# Patient Record
Sex: Female | Born: 1990 | Hispanic: No | Marital: Single | Smoking: Current every day smoker
Health system: Southern US, Community
[De-identification: ages and names within clinical notes are randomized; demographics above are authoritative.]

## PROBLEM LIST (undated history)

## (undated) DIAGNOSIS — J40 Bronchitis, not specified as acute or chronic: Secondary | ICD-10-CM

## (undated) DIAGNOSIS — I10 Essential (primary) hypertension: Secondary | ICD-10-CM

## (undated) HISTORY — PX: TONSILLECTOMY: SUR1361

---

## 2015-05-05 ENCOUNTER — Encounter (INDEPENDENT_AMBULATORY_CARE_PROVIDER_SITE_OTHER): Payer: Self-pay | Admitting: Family Medicine

## 2015-05-05 ENCOUNTER — Telehealth (INDEPENDENT_AMBULATORY_CARE_PROVIDER_SITE_OTHER): Payer: Self-pay | Admitting: Family Medicine

## 2015-05-05 DIAGNOSIS — K2970 Gastritis, unspecified, without bleeding: Principal | ICD-10-CM

## 2015-05-05 NOTE — Telephone Encounter (Signed)
Please schedule pt in fellows clinic to establish care at [facility]

## 2015-05-05 NOTE — Telephone Encounter (Signed)
Please triage referral to GI found in media tab.

## 2015-05-09 NOTE — Telephone Encounter (Signed)
Called the patient to schedule an appointment with GI. The person who answered the phone stated she was unavailable and the best time to reach her would be before 11:00am. Will try again on a different day.

## 2015-07-18 ENCOUNTER — Ambulatory Visit (HOSPITAL_BASED_OUTPATIENT_CLINIC_OR_DEPARTMENT_OTHER): Payer: MEDICAID | Admitting: Student in an Organized Health Care Education/Training Program

## 2016-06-07 ENCOUNTER — Encounter (HOSPITAL_COMMUNITY): Payer: Self-pay

## 2016-06-07 ENCOUNTER — Emergency Department (HOSPITAL_COMMUNITY)
Admission: EM | Admit: 2016-06-07 | Discharge: 2016-06-07 | Disposition: A | Discharge status: Home / Self Care | Payer: Self-pay | Attending: Emergency Medicine | Admitting: Emergency Medicine

## 2016-06-07 DIAGNOSIS — R042 Hemoptysis: Secondary | ICD-10-CM | POA: Insufficient documentation

## 2016-06-07 DIAGNOSIS — R112 Nausea with vomiting, unspecified: Secondary | ICD-10-CM | POA: Insufficient documentation

## 2016-06-07 DIAGNOSIS — R1011 Right upper quadrant pain: Secondary | ICD-10-CM | POA: Insufficient documentation

## 2016-06-07 DIAGNOSIS — Z5321 Procedure and treatment not carried out due to patient leaving prior to being seen by health care provider: Secondary | ICD-10-CM | POA: Insufficient documentation

## 2016-06-07 LAB — COMPREHENSIVE METABOLIC PANEL
ALBUMIN: 4.1 g/dL (ref 3.5–5.0)
ALK PHOS: 68 U/L (ref 38–126)
ALT: 20 U/L (ref 14–54)
ANION GAP: 8 (ref 5–15)
AST: 23 U/L (ref 15–41)
BILIRUBIN TOTAL: 0.8 mg/dL (ref 0.3–1.2)
BUN: 6 mg/dL (ref 6–20)
CALCIUM: 9.3 mg/dL (ref 8.9–10.3)
CO2: 25 mmol/L (ref 22–32)
CREATININE: 0.73 mg/dL (ref 0.44–1.00)
Chloride: 106 mmol/L (ref 101–111)
GFR calc Af Amer: >60 mL/min (ref >60)
GFR calc non Af Amer: >60 mL/min (ref >60)
Glucose, Bld: 94 mg/dL (ref 65–99)
Potassium: 4.1 mmol/L (ref 3.5–5.1)
Sodium: 139 mmol/L (ref 135–145)
TOTAL PROTEIN: 7 g/dL (ref 6.5–8.1)

## 2016-06-07 LAB — CBC
HCT: 41.3 % (ref 36.0–46.0)
Hemoglobin: 14.1 g/dL (ref 12.0–15.0)
MCH: 30.9 pg (ref 26.0–34.0)
MCHC: 34.1 g/dL (ref 30.0–36.0)
MCV: 90.4 fL (ref 78.0–100.0)
PLATELETS: 160 10*3/uL (ref 150–400)
RBC: 4.57 MIL/uL (ref 3.87–5.11)
RDW: 12.4 % (ref 11.5–15.5)
WBC: 6.2 10*3/uL (ref 4.0–10.5)

## 2016-06-07 LAB — LIPASE, BLOOD: Lipase: 12 U/L (ref 11–51)

## 2016-06-07 NOTE — ED Notes (Signed)
Family concerned that pt is coughing up blood.  Small blood tinged sputum in emesis bag.  Family notified of stable vital signs.

## 2016-06-07 NOTE — ED Triage Notes (Signed)
Pt states abd pain X1 week. RUQ pain. She reports nausea and vomiting with lack of appetite. Pt also coughing up a small amount of blood tinged sputum.

## 2016-06-28 ENCOUNTER — Emergency Department: Payer: Medicaid - Out of State

## 2016-06-28 ENCOUNTER — Emergency Department: Payer: Medicaid - Out of State | Admitting: *Deleted

## 2016-06-28 ENCOUNTER — Observation Stay
Admission: EM | Admit: 2016-06-28 | Discharge: 2016-06-30 | Disposition: A | Discharge status: Home / Self Care | Payer: Medicaid - Out of State | Attending: Obstetrics and Gynecology | Admitting: Obstetrics and Gynecology

## 2016-06-28 ENCOUNTER — Encounter
Admission: EM | Disposition: A | Discharge status: Home / Self Care | Payer: Self-pay | Source: Home / Self Care | Attending: Emergency Medicine

## 2016-06-28 ENCOUNTER — Encounter: Payer: Self-pay | Admitting: Emergency Medicine

## 2016-06-28 DIAGNOSIS — F17210 Nicotine dependence, cigarettes, uncomplicated: Secondary | ICD-10-CM | POA: Insufficient documentation

## 2016-06-28 DIAGNOSIS — N7003 Acute salpingitis and oophoritis: Secondary | ICD-10-CM | POA: Diagnosis present

## 2016-06-28 DIAGNOSIS — R102 Pelvic and perineal pain: Secondary | ICD-10-CM

## 2016-06-28 DIAGNOSIS — R109 Unspecified abdominal pain: Secondary | ICD-10-CM

## 2016-06-28 DIAGNOSIS — N7091 Salpingitis, unspecified: Principal | ICD-10-CM | POA: Insufficient documentation

## 2016-06-28 HISTORY — PX: DIAGNOSTIC LAPAROSCOPY WITH REMOVAL OF ECTOPIC PREGNANCY: SHX6449

## 2016-06-28 LAB — WET PREP, GENITAL
CLUE CELLS WET PREP: NONE SEEN
Sperm: NONE SEEN
TRICH WET PREP: NONE SEEN
YEAST WET PREP: NONE SEEN

## 2016-06-28 LAB — URINALYSIS, COMPLETE (UACMP) WITH MICROSCOPIC
Bacteria, UA: NONE SEEN
Bilirubin Urine: NEGATIVE
Glucose, UA: NEGATIVE mg/dL
KETONES UR: 5 mg/dL — AB
Leukocytes, UA: NEGATIVE
Nitrite: NEGATIVE
PROTEIN: 30 mg/dL — AB
Specific Gravity, Urine: 1.018 (ref 1.005–1.030)
pH: 6 (ref 5.0–8.0)

## 2016-06-28 LAB — COMPREHENSIVE METABOLIC PANEL
ALBUMIN: 4.4 g/dL (ref 3.5–5.0)
ALT: 14 U/L (ref 14–54)
ANION GAP: 7 (ref 5–15)
AST: 18 U/L (ref 15–41)
Alkaline Phosphatase: 73 U/L (ref 38–126)
BUN: 7 mg/dL (ref 6–20)
CHLORIDE: 106 mmol/L (ref 101–111)
CO2: 24 mmol/L (ref 22–32)
Calcium: 9.4 mg/dL (ref 8.9–10.3)
Creatinine, Ser: 0.6 mg/dL (ref 0.44–1.00)
GFR calc non Af Amer: >60 mL/min (ref >60)
GLUCOSE: 100 mg/dL — AB (ref 65–99)
Potassium: 3.8 mmol/L (ref 3.5–5.1)
SODIUM: 137 mmol/L (ref 135–145)
Total Bilirubin: 0.9 mg/dL (ref 0.3–1.2)
Total Protein: 8 g/dL (ref 6.5–8.1)

## 2016-06-28 LAB — CBC
HCT: 41.7 % (ref 35.0–47.0)
HEMOGLOBIN: 14.1 g/dL (ref 12.0–16.0)
MCH: 30.7 pg (ref 26.0–34.0)
MCHC: 33.8 g/dL (ref 32.0–36.0)
MCV: 90.8 fL (ref 80.0–100.0)
Platelets: 176 10*3/uL (ref 150–440)
RBC: 4.59 MIL/uL (ref 3.80–5.20)
RDW: 13 % (ref 11.5–14.5)
WBC: 11.5 10*3/uL — ABNORMAL HIGH (ref 3.6–11.0)

## 2016-06-28 LAB — CHLAMYDIA/NGC RT PCR (ARMC ONLY)
Chlamydia Tr: NOT DETECTED
N gonorrhoeae: DETECTED — AB

## 2016-06-28 LAB — HCG, QUANTITATIVE, PREGNANCY: HCG, BETA CHAIN, QUANT, S: 1 m[IU]/mL (ref <5)

## 2016-06-28 LAB — LIPASE, BLOOD: LIPASE: 22 U/L (ref 11–51)

## 2016-06-28 SURGERY — LAPAROSCOPY, WITH ECTOPIC PREGNANCY SURGICAL TREATMENT
Anesthesia: General | Site: Abdomen | Wound class: Clean Contaminated

## 2016-06-28 MED ORDER — PROMETHAZINE HCL 25 MG/ML IJ SOLN: 6.2500 mg | INTRAMUSCULAR | Status: DC | PRN

## 2016-06-28 MED ORDER — PROPOFOL 10 MG/ML IV BOLUS
INTRAVENOUS | Status: AC
  Filled 2016-06-28: qty 20

## 2016-06-28 MED ORDER — FENTANYL CITRATE (PF) 100 MCG/2ML IJ SOLN
INTRAMUSCULAR | Status: AC
  Administered 2016-06-28: 100 ug via INTRAVENOUS
  Filled 2016-06-28: qty 2

## 2016-06-28 MED ORDER — DOXYCYCLINE HYCLATE 100 MG IV SOLR
100.0000 mg | Freq: Once | INTRAVENOUS | Status: AC
  Administered 2016-06-28 (×2): 100 mg via INTRAVENOUS
  Filled 2016-06-28: qty 100

## 2016-06-28 MED ORDER — MORPHINE SULFATE (PF) 4 MG/ML IV SOLN
4.0000 mg | Freq: Once | INTRAVENOUS | Status: AC
  Administered 2016-06-28: 4 mg via INTRAVENOUS
  Filled 2016-06-28: qty 1

## 2016-06-28 MED ORDER — FENTANYL CITRATE (PF) 100 MCG/2ML IJ SOLN
INTRAMUSCULAR | Status: AC
  Filled 2016-06-28: qty 2

## 2016-06-28 MED ORDER — FENTANYL CITRATE (PF) 100 MCG/2ML IJ SOLN
INTRAMUSCULAR | Status: AC
  Administered 2016-06-28: 25 ug via INTRAVENOUS
  Filled 2016-06-28: qty 2

## 2016-06-28 MED ORDER — ONDANSETRON HCL 4 MG/2ML IJ SOLN
INTRAMUSCULAR | Status: AC
  Administered 2016-06-28: 4 mg via INTRAVENOUS
  Filled 2016-06-28: qty 2

## 2016-06-28 MED ORDER — ONDANSETRON HCL 4 MG/2ML IJ SOLN
4.0000 mg | Freq: Once | INTRAMUSCULAR | Status: AC
  Administered 2016-06-28: 4 mg via INTRAVENOUS

## 2016-06-28 MED ORDER — LIDOCAINE HCL (CARDIAC) 20 MG/ML IV SOLN
INTRAVENOUS | Status: DC | PRN
Start: 2016-06-28 — End: 2016-06-28
  Administered 2016-06-28: 100 mg via INTRAVENOUS

## 2016-06-28 MED ORDER — GLYCOPYRROLATE 0.2 MG/ML IJ SOLN
INTRAMUSCULAR | Status: AC
  Filled 2016-06-28: qty 1

## 2016-06-28 MED ORDER — LIDOCAINE HCL (PF) 2 % IJ SOLN
INTRAMUSCULAR | Status: AC
  Filled 2016-06-28: qty 2

## 2016-06-28 MED ORDER — DEXAMETHASONE SODIUM PHOSPHATE 10 MG/ML IJ SOLN
INTRAMUSCULAR | Status: AC
  Filled 2016-06-28: qty 1

## 2016-06-28 MED ORDER — KETOROLAC TROMETHAMINE 30 MG/ML IJ SOLN
INTRAMUSCULAR | Status: AC
  Filled 2016-06-28: qty 1

## 2016-06-28 MED ORDER — LACTATED RINGERS IV SOLN
INTRAVENOUS | Status: DC
  Administered 2016-06-28 – 2016-06-30 (×4): via INTRAVENOUS
  Administered 2016-06-30: 125 mL/h via INTRAVENOUS

## 2016-06-28 MED ORDER — MEPERIDINE HCL 50 MG/ML IJ SOLN: 6.2500 mg | INTRAMUSCULAR | Status: DC | PRN

## 2016-06-28 MED ORDER — ROCURONIUM BROMIDE 100 MG/10ML IV SOLN
INTRAVENOUS | Status: AC
  Filled 2016-06-28: qty 1

## 2016-06-28 MED ORDER — FENTANYL CITRATE (PF) 100 MCG/2ML IJ SOLN
100.0000 ug | Freq: Once | INTRAMUSCULAR | Status: AC
  Administered 2016-06-28: 100 ug via INTRAVENOUS

## 2016-06-28 MED ORDER — HYDROMORPHONE HCL 1 MG/ML IJ SOLN
INTRAMUSCULAR | Status: AC
  Administered 2016-06-28: 0.5 mg via INTRAVENOUS
  Filled 2016-06-28: qty 1

## 2016-06-28 MED ORDER — MIDAZOLAM HCL 2 MG/2ML IJ SOLN
INTRAMUSCULAR | Status: AC
  Filled 2016-06-28: qty 2

## 2016-06-28 MED ORDER — SODIUM CHLORIDE 0.9 % IR SOLN
Status: DC | PRN
  Administered 2016-06-28: 400 mL

## 2016-06-28 MED ORDER — OXYCODONE HCL 5 MG PO TABS: 5.0000 mg | ORAL_TABLET | Freq: Once | ORAL | Status: DC | PRN

## 2016-06-28 MED ORDER — SODIUM CHLORIDE 0.9 % IV BOLUS (SEPSIS)
1000.0000 mL | Freq: Once | INTRAVENOUS | Status: AC
  Administered 2016-06-28: 1000 mL via INTRAVENOUS

## 2016-06-28 MED ORDER — FENTANYL CITRATE (PF) 100 MCG/2ML IJ SOLN
25.0000 ug | INTRAMUSCULAR | Status: AC | PRN
  Administered 2016-06-28 (×6): 25 ug via INTRAVENOUS

## 2016-06-28 MED ORDER — ONDANSETRON HCL 4 MG/2ML IJ SOLN
INTRAMUSCULAR | Status: DC | PRN
Start: 2016-06-28 — End: 2016-06-28
  Administered 2016-06-28: 4 mg via INTRAVENOUS

## 2016-06-28 MED ORDER — SUCCINYLCHOLINE CHLORIDE 20 MG/ML IJ SOLN
INTRAMUSCULAR | Status: DC | PRN
  Administered 2016-06-28: 120 mg via INTRAVENOUS

## 2016-06-28 MED ORDER — LACTATED RINGERS IV SOLN
INTRAVENOUS | Status: DC | PRN
  Administered 2016-06-28: 22:00:00 via INTRAVENOUS

## 2016-06-28 MED ORDER — OXYCODONE HCL 5 MG/5ML PO SOLN: 5.0000 mg | Freq: Once | ORAL | Status: DC | PRN

## 2016-06-28 MED ORDER — ONDANSETRON HCL 4 MG/2ML IJ SOLN
INTRAMUSCULAR | Status: AC
  Filled 2016-06-28: qty 2

## 2016-06-28 MED ORDER — FENTANYL CITRATE (PF) 100 MCG/2ML IJ SOLN
INTRAMUSCULAR | Status: DC | PRN
  Administered 2016-06-28 (×4): 50 ug via INTRAVENOUS

## 2016-06-28 MED ORDER — PHENYLEPHRINE HCL 10 MG/ML IJ SOLN
INTRAMUSCULAR | Status: AC
  Filled 2016-06-28: qty 1

## 2016-06-28 MED ORDER — BUPIVACAINE HCL 0.5 % IJ SOLN
INTRAMUSCULAR | Status: DC | PRN
  Administered 2016-06-28: 10 mL

## 2016-06-28 MED ORDER — SUGAMMADEX SODIUM 200 MG/2ML IV SOLN
INTRAVENOUS | Status: AC
  Filled 2016-06-28: qty 2

## 2016-06-28 MED ORDER — HYDROMORPHONE HCL 1 MG/ML IJ SOLN
0.2500 mg | INTRAMUSCULAR | Status: DC | PRN
  Administered 2016-06-28 (×2): 0.5 mg via INTRAVENOUS

## 2016-06-28 MED ORDER — SUCCINYLCHOLINE CHLORIDE 20 MG/ML IJ SOLN
INTRAMUSCULAR | Status: AC
  Filled 2016-06-28: qty 1

## 2016-06-28 MED ORDER — MORPHINE SULFATE (PF) 2 MG/ML IV SOLN
INTRAVENOUS | Status: AC
  Administered 2016-06-28: 4 mg via INTRAVENOUS
  Filled 2016-06-28: qty 2

## 2016-06-28 MED ORDER — SUGAMMADEX SODIUM 500 MG/5ML IV SOLN
INTRAVENOUS | Status: DC | PRN
  Administered 2016-06-28: 181.4 mg via INTRAVENOUS

## 2016-06-28 MED ORDER — MIDAZOLAM HCL 2 MG/2ML IJ SOLN
INTRAMUSCULAR | Status: DC | PRN
  Administered 2016-06-28: 2 mg via INTRAVENOUS

## 2016-06-28 MED ORDER — DEXTROSE 5 % IV SOLN
2.0000 g | Freq: Once | INTRAVENOUS | Status: AC
  Administered 2016-06-28: 2 g via INTRAVENOUS
  Filled 2016-06-28: qty 2

## 2016-06-28 MED ORDER — PROPOFOL 10 MG/ML IV BOLUS
INTRAVENOUS | Status: DC | PRN
  Administered 2016-06-28: 200 mg via INTRAVENOUS

## 2016-06-28 MED ORDER — EPHEDRINE SULFATE 50 MG/ML IJ SOLN
INTRAMUSCULAR | Status: AC
  Filled 2016-06-28: qty 1

## 2016-06-28 MED ORDER — ARTIFICIAL TEARS OPHTHALMIC OINT
TOPICAL_OINTMENT | OPHTHALMIC | Status: AC
  Filled 2016-06-28: qty 3.5

## 2016-06-28 MED ORDER — ONDANSETRON HCL 4 MG/2ML IJ SOLN
4.0000 mg | Freq: Once | INTRAMUSCULAR | Status: AC
  Administered 2016-06-28: 4 mg via INTRAVENOUS
  Filled 2016-06-28: qty 2

## 2016-06-28 SURGICAL SUPPLY — 42 items
BAG URO DRAIN 2000ML W/SPOUT (MISCELLANEOUS) IMPLANT
BLADE SURG SZ11 CARB STEEL (BLADE) ×3 IMPLANT
CATH FOLEY 2WAY  5CC 16FR (CATHETERS)
CATH ROBINSON RED A/P 16FR (CATHETERS) ×3 IMPLANT
CATH URTH 16FR FL 2W BLN LF (CATHETERS) IMPLANT
CHLORAPREP W/TINT 26ML (MISCELLANEOUS) ×3 IMPLANT
CLOSURE WOUND 1/4X4 (GAUZE/BANDAGES/DRESSINGS)
DERMABOND ADVANCED (GAUZE/BANDAGES/DRESSINGS) ×2
DERMABOND ADVANCED .7 DNX12 (GAUZE/BANDAGES/DRESSINGS) ×1 IMPLANT
DRSG TEGADERM 2-3/8X2-3/4 SM (GAUZE/BANDAGES/DRESSINGS) IMPLANT
ENDOPOUCH RETRIEVER 10 (MISCELLANEOUS) IMPLANT
GAUZE SPONGE NON-WVN 2X2 STRL (MISCELLANEOUS) IMPLANT
GLOVE BIO SURGEON STRL SZ7 (GLOVE) ×12 IMPLANT
GLOVE INDICATOR 7.5 STRL GRN (GLOVE) ×3 IMPLANT
GOWN STRL REUS W/ TWL LRG LVL3 (GOWN DISPOSABLE) ×2 IMPLANT
GOWN STRL REUS W/TWL LRG LVL3 (GOWN DISPOSABLE) ×4
IRRIGATION STRYKERFLOW (MISCELLANEOUS) ×1 IMPLANT
IRRIGATOR STRYKERFLOW (MISCELLANEOUS) ×3
IV NS 1000ML (IV SOLUTION) ×2
IV NS 1000ML BAXH (IV SOLUTION) ×1 IMPLANT
KIT RM TURNOVER CYSTO AR (KITS) ×3 IMPLANT
LABEL OR SOLS (LABEL) IMPLANT
LIGASURE LAP MARYLAND 5MM 37CM (ELECTROSURGICAL) IMPLANT
NS IRRIG 500ML POUR BTL (IV SOLUTION) ×3 IMPLANT
PACK GYN LAPAROSCOPIC (MISCELLANEOUS) ×3 IMPLANT
PAD OB MATERNITY 4.3X12.25 (PERSONAL CARE ITEMS) ×3 IMPLANT
PAD PREP 24X41 OB/GYN DISP (PERSONAL CARE ITEMS) ×3 IMPLANT
SCISSORS METZENBAUM CVD 33 (INSTRUMENTS) IMPLANT
SLEEVE ENDOPATH XCEL 5M (ENDOMECHANICALS) ×3 IMPLANT
SLEEVE SCD COMPRESS THIGH MED (MISCELLANEOUS) ×3 IMPLANT
SPONGE VERSALON 2X2 STRL (MISCELLANEOUS)
STRIP CLOSURE SKIN 1/4X4 (GAUZE/BANDAGES/DRESSINGS) IMPLANT
SUT MNCRL AB 4-0 PS2 18 (SUTURE) ×3 IMPLANT
SUT VIC AB 2-0 UR6 27 (SUTURE) ×3 IMPLANT
SUT VIC AB 4-0 SH 27 (SUTURE) ×2
SUT VIC AB 4-0 SH 27XANBCTRL (SUTURE) ×1 IMPLANT
SWAB CULTURE AMIES ANAERIB BLU (MISCELLANEOUS) ×3 IMPLANT
SWABSTK COMLB BENZOIN TINCTURE (MISCELLANEOUS) IMPLANT
TROCAR ENDO BLADELESS 11MM (ENDOMECHANICALS) IMPLANT
TROCAR XCEL NON-BLD 5MMX100MML (ENDOMECHANICALS) ×3 IMPLANT
TROCAR XCEL UNIV SLVE 11M 100M (ENDOMECHANICALS) IMPLANT
TUBING INSUFFLATOR HI FLOW (MISCELLANEOUS) ×3 IMPLANT

## 2016-06-28 NOTE — ED Triage Notes (Signed)
Pt reports RLQ abdominal pain for three days that has increased in severity. Pt reports difficulty walking due to the pain. Pt also reports NVD that began yesterday. Pt reports fever up to 101.2 yesterday. Pt tearful in triage.

## 2016-06-28 NOTE — ED Notes (Signed)
Consent for surgery signed by pt and witnessed by RN. Consent in chart

## 2016-06-28 NOTE — Anesthesia Procedure Notes (Signed)
Procedure Name: Intubation Date/Time: 06/28/2016 9:47 PM Performed by: Jennette Bill Pre-anesthesia Checklist: Patient identified, Emergency Drugs available, Suction available, Patient being monitored and Timeout performed Patient Re-evaluated:Patient Re-evaluated prior to inductionOxygen Delivery Method: Circle system utilized Preoxygenation: Pre-oxygenation with 100% oxygen Intubation Type: IV induction Laryngoscope Size: Mac and 3 Grade View: Grade II Tube type: Oral Tube size: 7.0 mm Number of attempts: 1 Airway Equipment and Method: Patient positioned with wedge pillow and Stylet Placement Confirmation: ETT inserted through vocal cords under direct vision,  positive ETCO2 and breath sounds checked- equal and bilateral Secured at: 22 cm Tube secured with: Tape

## 2016-06-28 NOTE — Discharge Summary (Signed)
GynecologicalDischarge Summary  Patient Name: Tara Horton DOB: 10-09-1990 MRN: [id number]  Date of Admission: 06/28/2016 Date of Discharge: 06/30/2016  Hospital course:   The patient presented to the ER with acute right pelvic pain and ovarian torsion was suspected. She underwent dx laparoscopy and acute right salpingitis was noted. She was started on doxy/cefox iv in the ED, and continued postop. She was admitted for pain control and overnight observation.  Her surgery was uncomplicated, with minimal EBL.  (For complete operative information, please see operative report).  Postoperatively she was admitted to the floor. Pain was initially managed with iv Dilaudid which was transitioned to PO  when the patient was tolerating a regular diet on postoperative day number 1.  Postoperative labs were stable Results for orders placed or performed during the hospital encounter of 06/28/16 (from the past 24 hour(s))  CBC     Status: None   Collection Time: 06/30/16  9:30 AM  Result Value Ref Range   WBC 7.7 3.6 - 11.0 K/uL   RBC 4.01 3.80 - 5.20 MIL/uL   Hemoglobin 12.5 12.0 - 16.0 g/dL   HCT 40.9 81.1 - 91.4 %   MCV 90.3 80.0 - 100.0 fL   MCH 31.1 26.0 - 34.0 pg   MCHC 34.4 32.0 - 36.0 g/dL   RDW 78.2 95.6 - 21.3 %   Platelets 164 150 - 440 K/uL     Patient was felt to be stable for discharge on postoperative day number 2 when she was tolerating a regular diet, pain was controlled with po pain medications, and she was ambulating and voiding without difficulty. Vital signs were stable and physical exam remained benign throughout her hospital stay. Incision was clean,dry, and intact..  She will follow up per below for post-op check.  Rx given for oxycodone, Zofran.    She was given specific instructions and numbers to call in written and verbal format. She verbalized understanding, agrees with the plan of care, and all questions answered to her satisfaction.  Discharge Physical Exam:  BP  105/61 (BP Location: Left Arm)   Pulse 75   Temp 98.2 F (36.8 C) (Oral)   Resp 18   Ht  (1.676 m)   Wt 90.7 kg (200 lb)   LMP 06/27/2016   SpO2 100%   BMI 32.28 kg/m   General: NAD CV: RRR Pulm: CTABL, nl effort ABD: s/nd/nt Incision: c/d/i   DVT Evaluation: LE non-ttp, no evidence of DVT on exam.  Hemoglobin  Date Value Ref Range Status  06/30/2016 12.5 12.0 - 16.0 g/dL Final   HCT  Date Value Ref Range Status  06/30/2016 36.2 35.0 - 47.0 % Final      Plan:  Tara Horton was discharged to home in good condition. Follow-up appointment at [facility] in 2 weeks   Discharge Medications: Allergies as of 06/30/2016      Reactions   Ibuprofen    gastritis      Medication List    TAKE these medications   doxycycline 100 MG capsule Commonly known as:  MONODOX Take 1 capsule (100 mg total) by mouth 2 (two) times daily.   metroNIDAZOLE 500 MG tablet Commonly known as:  FLAGYL Take 1 tablet (500 mg total) by mouth 3 (three) times daily.   ondansetron 4 MG tablet Commonly known as:  ZOFRAN Take 1 tablet (4 mg total) by mouth every 6 (six) hours as needed for nausea.   oxyCODONE 5 MG immediate release tablet  Commonly known as:  Oxy IR/ROXICODONE Take 1 tablet (5 mg total) by mouth every 4 (four) hours as needed (pain scale 4 to 7).       Follow-up Information    Christeen Douglas, MD Follow up in 2 week(s).   Specialty:  Obstetrics and Gynecology Why:  For postop check Contact information: [address] [place] [address] [phone]           Signed: Elenora Fender Legaci Tarman, MD 3:32 PM

## 2016-06-28 NOTE — ED Provider Notes (Signed)
[facility] Emergency Department Provider Note  ____________________________________________   I have reviewed the triage vital signs and the nursing notes.   HISTORY  Chief Complaint Abdominal Pain; Emesis; and Diarrhea   History limited by: Not Limited   HPI Tara Horton is a 26 y.o. female who presents to the emergency department today because of concerns for abdominal pain. It is located in the right lower quadrant. It is been present for the past week. It has been severe for 1 day. It is worse with walking. He developed associated nausea today. The patient had subjective fever that started yesterday. States she has not been able to urinate today. Denies any change in defecation.    History reviewed. No pertinent past medical history.  There are no active problems to display for this patient.   Past Surgical History:  Procedure Laterality Date  . TONSILLECTOMY      Prior to Admission medications   Not on File    Allergies Ibuprofen  No family history on file.  Social History Social History  Substance Use Topics  . Smoking status: Current Every Day Smoker    Packs/day: 1.00  . Smokeless tobacco: Never Used  . Alcohol use No    Review of Systems Constitutional:Positive for subjective fever. Eyes: No visual changes. ENT: No sore throat. Cardiovascular: Denies chest pain. Respiratory: Denies shortness of breath. Gastrointestinal: Positive for abdominal pain. Genitourinary: Positive for decreased urination.  Musculoskeletal: Negative for back pain. Skin: Negative for rash. Neurological: Negative for headaches, focal weakness or numbness.  ____________________________________________   PHYSICAL EXAM:  VITAL SIGNS: ED Triage Vitals [06/28/16 1401]  Enc Vitals Group     BP (!) 148/89     Pulse Rate (!) 113     Resp 20     Temp 98.4 F (36.9 C)     Temp Source Oral     SpO2 98 %     Weight 200 lb (90.7 kg)     Height   (1.676 m)     Head Circumference      Peak Flow      Pain Score 10   Constitutional: Alert and oriented. Appears uncomfortable.  Eyes: Conjunctivae are normal. Normal extraocular movements. ENT   Head: Normocephalic and atraumatic.   Nose: No congestion/rhinnorhea.   Mouth/Throat: Mucous membranes are moist.   Neck: No stridor. Hematological/Lymphatic/Immunilogical: No cervical lymphadenopathy. Cardiovascular: Normal rate, regular rhythm.  No murmurs, rubs, or gallops.  Respiratory: Normal respiratory effort without tachypnea nor retractions. Breath sounds are clear and equal bilaterally. No wheezes/rales/rhonchi. Gastrointestinal: Soft and tender over the whole right abdomen. Positive right sided cva tenderness. No rebound. No guarding.  Genitourinary: No external lesions. Some blood in vaginal vault. Diffuse tenderness to cervical motion and adnexal palpation. No pus appreciated from os.  Musculoskeletal: Normal range of motion in all extremities. No lower extremity edema. Neurologic:  Normal speech and language. No gross focal neurologic deficits are appreciated.  Skin:  Skin is warm, dry and intact. No rash noted. Psychiatric: Mood and affect are normal. Speech and behavior are normal. Patient exhibits appropriate insight and judgment.  ____________________________________________    LABS (pertinent positives/negatives)  Labs Reviewed  WET PREP, GENITAL - Abnormal; Notable for the following:       Result Value   WBC, Wet Prep HPF POC FEW (*)    All other components within normal limits  COMPREHENSIVE METABOLIC PANEL - Abnormal; Notable for the following:    Glucose, Bld 100 (*)  All other components within normal limits  CBC - Abnormal; Notable for the following:    WBC 11.5 (*)    All other components within normal limits  URINALYSIS, COMPLETE (UACMP) WITH MICROSCOPIC - Abnormal; Notable for the following:    Color, Urine YELLOW (*)    APPearance HAZY  (*)    Hgb urine dipstick LARGE (*)    Ketones, ur 5 (*)    Protein, ur 30 (*)    Squamous Epithelial / LPF 0-5 (*)    All other components within normal limits  CHLAMYDIA/NGC RT PCR ([facility] ONLY)  LIPASE, BLOOD  HCG, QUANTITATIVE, PREGNANCY  POC URINE PREG, ED     ____________________________________________   EKG  None  ____________________________________________    RADIOLOGY  CT abd/pel IMPRESSION:  1. Hazy margins of the right ovary with a small amount of free  pelvic fluid raise the possibility of right ovarian or adnexal  inflammatory process. Quantitative beta HCG was negative today.  Inflammatory process of the right ovary could give this appearance,  consider pelvic sonography for further characterization.  2. No appendiceal abnormality observed.  3. There is a 3 mm left kidney lower pole nonobstructive renal  calculus.        Pelvic US IMPRESSION:  No torsion of either ovary. Likely physiologic small amount of free  fluid in the pelvis.    Physiologic sized follicle of the left ovary.    No uterine abnormality noted.      ____________________________________________   PROCEDURES  Procedures  ____________________________________________   INITIAL IMPRESSION / ASSESSMENT AND PLAN / ED COURSE  Pertinent labs & imaging results that were available during my care of the patient were reviewed by me and considered in my medical decision making (see chart for details).  Patient presented to the emergency department today because of concerns for right lower quadrant pain. Patient does state that she has a history of kidney stones and urine did show red blood cells. Because of this a CT scan was initially ordered. While this did not show any kidney stones would be causing the patient's pain did show inflammation around the right ovary. I did contact OB/GYN at this point. Patient did undergo ultrasound which did not show any concerning  findings at the range. However OB/GYN did evaluate the patient and given somewhat intermittent pain and concern for intermittent torsion the patient will be taken to the operating room for exploratory lap. In addition I did order the patient IV antibiotics in case the pain is more due to PID.  ____________________________________________   FINAL CLINICAL IMPRESSION(S) / ED DIAGNOSES  Final diagnoses:  Right flank pain  Pelvic pain  Pelvic pain     Note: This dictation was prepared with Dragon dictation. Any transcriptional errors that result from this process are unintentional     Phineas Semen, MD 06/28/16 2126

## 2016-06-28 NOTE — Anesthesia Preprocedure Evaluation (Signed)
Anesthesia Evaluation  Patient identified by MRN, date of birth, ID band Patient awake    Reviewed: Allergy & Precautions, NPO status , Patient's Chart, lab work & pertinent test results  History of Anesthesia Complications Negative for: history of anesthetic complications  Airway Mallampati: II  TM Distance: >3 FB Neck ROM: Full    Dental  (+) Missing, Poor Dentition   Pulmonary neg sleep apnea, neg COPD, Current Smoker,    breath sounds clear to auscultation- rhonchi (-) wheezing      Cardiovascular Exercise Tolerance: Good (-) hypertension(-) CAD and (-) Past MI  Rhythm:Regular Rate:Normal - Systolic murmurs and - Diastolic murmurs    Neuro/Psych negative neurological ROS  negative psych ROS   GI/Hepatic negative GI ROS, Neg liver ROS,   Endo/Other  negative endocrine ROSneg diabetes  Renal/GU negative Renal ROS     Musculoskeletal negative musculoskeletal ROS (+)   Abdominal (+) + obese,   Peds  Hematology negative hematology ROS (+)   Anesthesia Other Findings   Reproductive/Obstetrics                             Anesthesia Physical Anesthesia Plan  ASA: II  Anesthesia Plan: General   Post-op Pain Management:    Induction: Intravenous, Rapid sequence and Cricoid pressure planned  Airway Management Planned: Oral ETT  Additional Equipment:   Intra-op Plan:   Post-operative Plan: Extubation in OR  Informed Consent: I have reviewed the patients History and Physical, chart, labs and discussed the procedure including the risks, benefits and alternatives for the proposed anesthesia with the patient or authorized representative who has indicated his/her understanding and acceptance.   Dental advisory given  Plan Discussed with: CRNA and Anesthesiologist  Anesthesia Plan Comments:         Anesthesia Quick Evaluation

## 2016-06-28 NOTE — Transfer of Care (Signed)
Immediate Anesthesia Transfer of Care Note  Patient: Tara Horton  Procedure(s) Performed: Procedure(s): DIAGNOSTIC LAPAROSCOPY (N/A)  Patient Location: PACU  Anesthesia Type:General  Level of Consciousness: awake, alert  and oriented  Airway & Oxygen Therapy: Patient Spontanous Breathing and Patient connected to face mask oxygen  Post-op Assessment: Report given to RN and Post -op Vital signs reviewed and stable  Post vital signs: Reviewed and stable  Last Vitals:  Vitals:   06/28/16 2245 06/28/16 2250  BP:    Pulse: 96 86  Resp: (!) 26 (!) 32  Temp:      Last Pain:  Vitals:   06/28/16 2117  TempSrc:   PainSc: 8          Complications: No apparent anesthesia complications

## 2016-06-28 NOTE — Op Note (Addendum)
Tara Horton PROCEDURE DATE: 06/28/2016  INDICATIONS: Acute pelvic pain  PREOPERATIVE DIAGNOSIS: Suspected ovarian torsion POSTOPERATIVE DIAGNOSIS: Right salpingitis PROCEDURE: Exam under anesthesia, diagnostic laparoscopy, irritation of the pelvis SURGEON:  Dr. Christeen Douglas ASSISTANT: CST ANESTHESIOLOGIST: Alver Fisher, MD Anesthesiologist: Alver Fisher, MD CRNA: Edyth Gunnels  INDICATIONS: 26 y.o. G0 with history of acute-onset pelvic pain x20 hrs with minimal elevation in WBC, neg preg test and normal imaging, desiring surgical evaluation.   Please see preoperative notes for further details.  Risks of surgery were discussed with the patient including but not limited to: bleeding which may require transfusion or reoperation; infection which may require antibiotics; injury to bowel, bladder, ureters or other surrounding organs; need for additional procedures including laparotomy; thromboembolic phenomenon, incisional problems and other postoperative/anesthesia complications. Written informed consent was obtained.    FINDINGS:  Small uterus, normal right ovary and fallopian tube. Left fallopian tube with evidence of edematous inflammatory exudate in isthmic portion of tube and normal fibriae, with edematous appearing exudate on right ovary but no ovarian pathology. OF NOTE, in the posterior cul-de-sac was a window into the posterior peritoneum that appeared to be reactionary, and my best guess is that the tube was adherent to this area prior to surgery. There was a small reactionary area on the sigmoid colon adjacent to the tube. No other abdominal/pelvic abnormality.  Normal upper abdomen; specifically, no evidence of Fitz-Hugh-Curtis syndrome. No Hemoperitoneum. Normal appendix. No tubo-ovarian abscess.  ANESTHESIA:    General INTRAVENOUS FLUIDS: 1000 ml ESTIMATED BLOOD LOSS: none HEMOPERITONEUM: none URINE OUTPUT: 50 ml SPECIMENS: Peritoneal fluid culture COMPLICATIONS: None  immediate  PROCEDURE IN DETAIL:  The patient had sequential compression devices applied to her lower extremities while in the preoperative area.  She was then taken to the operating room where general anesthesia was administered and was found to be adequate.  She was placed in the dorsal lithotomy position, and was prepped and draped in a sterile manner.  A Foley catheter was inserted into her bladder and attached to constant drainage and a uterine manipulator was then advanced into the uterus .  After an adequate timeout was performed and local anesthetic with 0.5% Marcaine was placed, attention was turned to the abdomen where an umbilical incision was made with the scalpel.  The Optiview 5-mm trocar and sleeve were then advanced without difficulty with the laparoscope under direct visualization into the abdomen.  The abdomen was then insufflated with carbon dioxide gas and adequate pneumoperitoneum was obtained.   A detailed survey of the patient's pelvis and abdomen revealed the findings as mentioned above. One additional 5mm trocar was placed in the right lower quadrant under direct visualization.  The findings were noted as above. Pictures were taken of all quadrants. The pelvis was irrigated and all fluid and blood removed. General surgery was consulted to evaluate the inflammatory areas of the bowel and peritoneum; appendix was viewed at that time.The patient was placed in reverse Trendelenburg and all fluid removed.  No intraoperative injury to surrounding organs was noted.   Pictures were taken of the quadrants and pelvis. The abdomen was desufflated and all instruments were then removed from the patient's abdomen. The uterine manipulator was removed without complications.  All incisions were closed with 4-0 Vicryl and Dermabond.   The patient tolerated the procedures well.  All instruments, needles, and sponge counts were correct x 2. The patient was taken to the recovery room in stable  condition.
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
## 2016-06-29 ENCOUNTER — Encounter: Payer: Self-pay | Admitting: Obstetrics and Gynecology

## 2016-06-29 LAB — BASIC METABOLIC PANEL
ANION GAP: 5 (ref 5–15)
BUN: 5 mg/dL — ABNORMAL LOW (ref 6–20)
CALCIUM: 8.5 mg/dL — AB (ref 8.9–10.3)
CO2: 28 mmol/L (ref 22–32)
CREATININE: 0.68 mg/dL (ref 0.44–1.00)
Chloride: 103 mmol/L (ref 101–111)
Glucose, Bld: 92 mg/dL (ref 65–99)
Potassium: 3.4 mmol/L — ABNORMAL LOW (ref 3.5–5.1)
SODIUM: 136 mmol/L (ref 135–145)

## 2016-06-29 LAB — CBC
HEMATOCRIT: 36 % (ref 35.0–47.0)
Hemoglobin: 12.5 g/dL (ref 12.0–16.0)
MCH: 32 pg (ref 26.0–34.0)
MCHC: 34.9 g/dL (ref 32.0–36.0)
MCV: 91.8 fL (ref 80.0–100.0)
PLATELETS: 134 10*3/uL — AB (ref 150–440)
RBC: 3.92 MIL/uL (ref 3.80–5.20)
RDW: 13 % (ref 11.5–14.5)
WBC: 11.9 10*3/uL — AB (ref 3.6–11.0)

## 2016-06-29 MED ORDER — OXYCODONE HCL 5 MG PO TABS: 5.0000 mg | ORAL_TABLET | ORAL | Status: DC | PRN

## 2016-06-29 MED ORDER — DOCUSATE SODIUM 100 MG PO CAPS
100.0000 mg | ORAL_CAPSULE | Freq: Two times a day (BID) | ORAL | Status: DC
  Administered 2016-06-29 – 2016-06-30 (×3): 100 mg via ORAL
  Filled 2016-06-29 (×3): qty 1

## 2016-06-29 MED ORDER — ONDANSETRON HCL 4 MG/2ML IJ SOLN
4.0000 mg | Freq: Four times a day (QID) | INTRAMUSCULAR | Status: DC | PRN
Start: 2016-06-29 — End: 2016-06-30
  Administered 2016-06-29 (×2): 4 mg via INTRAVENOUS
  Filled 2016-06-29 (×2): qty 2

## 2016-06-29 MED ORDER — HYDROMORPHONE HCL 1 MG/ML IJ SOLN
0.2000 mg | INTRAMUSCULAR | Status: DC | PRN
  Administered 2016-06-29 (×3): 0.6 mg via INTRAVENOUS
  Administered 2016-06-29: 1 mg via INTRAVENOUS
  Administered 2016-06-29: 0.6 mg via INTRAVENOUS
  Administered 2016-06-30: 1 mg via INTRAVENOUS
  Administered 2016-06-30: 0.6 mg via INTRAVENOUS
  Filled 2016-06-29 (×7): qty 1

## 2016-06-29 MED ORDER — OXYCODONE-ACETAMINOPHEN 5-325 MG PO TABS
1.0000 | ORAL_TABLET | Freq: Four times a day (QID) | ORAL | Status: DC | PRN
  Administered 2016-06-29 (×3): 2 via ORAL
  Filled 2016-06-29 (×3): qty 2

## 2016-06-29 MED ORDER — DOCUSATE SODIUM 100 MG PO CAPS: 100.0000 mg | ORAL_CAPSULE | Freq: Two times a day (BID) | ORAL | Status: DC

## 2016-06-29 MED ORDER — ACETAMINOPHEN 500 MG PO TABS
1000.0000 mg | ORAL_TABLET | Freq: Four times a day (QID) | ORAL | Status: DC
Start: 2016-06-29 — End: 2016-06-30
  Administered 2016-06-29 – 2016-06-30 (×3): 1000 mg via ORAL
  Filled 2016-06-29 (×4): qty 2

## 2016-06-29 MED ORDER — ONDANSETRON HCL 4 MG PO TABS: 4.0000 mg | ORAL_TABLET | Freq: Four times a day (QID) | ORAL | Status: DC | PRN

## 2016-06-29 MED ORDER — METOCLOPRAMIDE HCL 5 MG/ML IJ SOLN: 10.0000 mg | Freq: Four times a day (QID) | INTRAMUSCULAR | Status: DC | PRN

## 2016-06-29 MED ORDER — CEFOXITIN SODIUM-DEXTROSE 2-2.2 GM-% IV SOLR (PREMIX)
2.0000 g | Freq: Four times a day (QID) | INTRAVENOUS | Status: DC
  Administered 2016-06-29 – 2016-06-30 (×5): 2000 mg via INTRAVENOUS
  Filled 2016-06-29 (×9): qty 50

## 2016-06-29 MED ORDER — MENTHOL 3 MG MT LOZG
1.0000 | LOZENGE | OROMUCOSAL | Status: DC | PRN
  Filled 2016-06-29: qty 9

## 2016-06-29 MED ORDER — DEXTROSE 5 % IV SOLN
2.0000 g | Freq: Four times a day (QID) | INTRAVENOUS | Status: DC
  Administered 2016-06-29 (×2): 2 g via INTRAVENOUS
  Filled 2016-06-29 (×5): qty 2

## 2016-06-29 MED ORDER — OXYCODONE HCL 5 MG PO TABS
10.0000 mg | ORAL_TABLET | ORAL | Status: DC | PRN
  Administered 2016-06-29 – 2016-06-30 (×2): 10 mg via ORAL
  Filled 2016-06-29 (×2): qty 2

## 2016-06-29 MED ORDER — PROMETHAZINE HCL 25 MG/ML IJ SOLN
25.0000 mg | INTRAMUSCULAR | Status: DC | PRN
Start: 2016-06-29 — End: 2016-06-30
  Administered 2016-06-29: 25 mg via INTRAVENOUS
  Filled 2016-06-29: qty 1

## 2016-06-29 MED ORDER — DOXYCYCLINE HYCLATE 100 MG IV SOLR
100.0000 mg | Freq: Two times a day (BID) | INTRAVENOUS | Status: DC
  Administered 2016-06-29 – 2016-06-30 (×4): 100 mg via INTRAVENOUS
  Filled 2016-06-29 (×6): qty 100

## 2016-06-29 NOTE — Progress Notes (Signed)
Spoke with Dr. Elesa Massed concerning patient pain level. She ordered a k-pad, tylenol 1000 mg every 6 hours, and changed percocet to roxicodone with frequency of 4 hours rather than 6 hours.  See new orders for any questions.

## 2016-06-29 NOTE — Progress Notes (Signed)
Call from lab to confirm that patient positive for gonorrhea---called MD to report results.

## 2016-06-29 NOTE — Anesthesia Postprocedure Evaluation (Signed)
Anesthesia Post Note  Patient: Tara Horton  Procedure(s) Performed: Procedure(s) (LRB): DIAGNOSTIC LAPAROSCOPY (N/A)  Patient location during evaluation: PACU Anesthesia Type: General Level of consciousness: awake and alert and oriented Pain management: pain level controlled Vital Signs Assessment: post-procedure vital signs reviewed and stable Respiratory status: spontaneous breathing, nonlabored ventilation and respiratory function stable Cardiovascular status: blood pressure returned to baseline and stable Postop Assessment: no signs of nausea or vomiting Anesthetic complications: no     Last Vitals:  Vitals:   06/29/16 0102 06/29/16 0159  BP: (!) 103/53 (!) 102/47  Pulse: 81 84  Resp: 18 18  Temp: 36.9 C 36.8 C    Last Pain:  Vitals:   06/29/16 0159  TempSrc: Oral  PainSc:                  Kevontae Burgoon

## 2016-06-30 ENCOUNTER — Encounter: Payer: Self-pay | Admitting: Obstetrics & Gynecology

## 2016-06-30 LAB — CBC
HCT: 36.2 % (ref 35.0–47.0)
Hemoglobin: 12.5 g/dL (ref 12.0–16.0)
MCH: 31.1 pg (ref 26.0–34.0)
MCHC: 34.4 g/dL (ref 32.0–36.0)
MCV: 90.3 fL (ref 80.0–100.0)
PLATELETS: 164 10*3/uL (ref 150–440)
RBC: 4.01 MIL/uL (ref 3.80–5.20)
RDW: 13.3 % (ref 11.5–14.5)
WBC: 7.7 10*3/uL (ref 3.6–11.0)

## 2016-06-30 MED ORDER — LACTATED RINGERS IV BOLUS (SEPSIS)
500.0000 mL | Freq: Once | INTRAVENOUS | Status: AC
  Administered 2016-06-30: 500 mL via INTRAVENOUS

## 2016-06-30 MED ORDER — OXYCODONE HCL 5 MG PO TABS: 5.0000 mg | ORAL_TABLET | ORAL | 0 refills | Status: DC | PRN

## 2016-06-30 MED ORDER — DOXYCYCLINE MONOHYDRATE 100 MG PO CAPS
100.0000 mg | ORAL_CAPSULE | Freq: Two times a day (BID) | ORAL | 0 refills | Status: DC
Start: 2016-06-30 — End: 2016-08-17

## 2016-06-30 MED ORDER — ONDANSETRON HCL 4 MG PO TABS: 4.0000 mg | ORAL_TABLET | Freq: Four times a day (QID) | ORAL | 0 refills | Status: DC | PRN

## 2016-06-30 MED ORDER — METRONIDAZOLE 500 MG PO TABS: 500.0000 mg | ORAL_TABLET | Freq: Three times a day (TID) | ORAL | 0 refills | Status: DC
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
## 2016-06-30 NOTE — Progress Notes (Signed)
Discharged to home to car via WC. 

## 2016-06-30 NOTE — Progress Notes (Signed)
Discharge inst reviewed.  Ambulating in hallway.  Waiting on Rx to be signed

## 2016-06-30 NOTE — Plan of Care (Signed)
PATIENT AMBULATING IN HALLWAY AS DIRECTED PER RN RS. PAIN IMPROVED WITH AMBULATION, PATIENT NOTING INCREASED BOWEL MOTILITY. [facility]

## 2016-07-01 ENCOUNTER — Encounter: Payer: Self-pay | Admitting: Emergency Medicine

## 2016-07-01 ENCOUNTER — Emergency Department: Payer: Self-pay

## 2016-07-01 ENCOUNTER — Observation Stay
Admission: EM | Admit: 2016-07-01 | Discharge: 2016-07-02 | Disposition: A | Discharge status: Home / Self Care | Payer: Self-pay | Attending: Obstetrics and Gynecology | Admitting: Obstetrics and Gynecology

## 2016-07-01 DIAGNOSIS — R102 Pelvic and perineal pain: Secondary | ICD-10-CM

## 2016-07-01 DIAGNOSIS — N7003 Acute salpingitis and oophoritis: Secondary | ICD-10-CM | POA: Diagnosis present

## 2016-07-01 DIAGNOSIS — N7001 Acute salpingitis: Principal | ICD-10-CM | POA: Insufficient documentation

## 2016-07-01 DIAGNOSIS — R1030 Lower abdominal pain, unspecified: Secondary | ICD-10-CM

## 2016-07-01 LAB — COMPREHENSIVE METABOLIC PANEL
ALBUMIN: 4 g/dL (ref 3.5–5.0)
ALT: 15 U/L (ref 14–54)
ANION GAP: 10 (ref 5–15)
AST: 23 U/L (ref 15–41)
Alkaline Phosphatase: 65 U/L (ref 38–126)
BILIRUBIN TOTAL: 0.9 mg/dL (ref 0.3–1.2)
BUN: <5 mg/dL — ABNORMAL LOW (ref 6–20)
CO2: 22 mmol/L (ref 22–32)
Calcium: 9.6 mg/dL (ref 8.9–10.3)
Chloride: 105 mmol/L (ref 101–111)
Creatinine, Ser: 0.52 mg/dL (ref 0.44–1.00)
GFR calc Af Amer: >60 mL/min (ref >60)
Glucose, Bld: 105 mg/dL — ABNORMAL HIGH (ref 65–99)
POTASSIUM: 3.8 mmol/L (ref 3.5–5.1)
Sodium: 137 mmol/L (ref 135–145)
TOTAL PROTEIN: 7.6 g/dL (ref 6.5–8.1)

## 2016-07-01 LAB — URINALYSIS, COMPLETE (UACMP) WITH MICROSCOPIC
BACTERIA UA: NONE SEEN
Bilirubin Urine: NEGATIVE
Glucose, UA: NEGATIVE mg/dL
KETONES UR: 20 mg/dL — AB
NITRITE: NEGATIVE
PROTEIN: 30 mg/dL — AB
SPECIFIC GRAVITY, URINE: 1.017 (ref 1.005–1.030)
pH: 6 (ref 5.0–8.0)

## 2016-07-01 LAB — LIPASE, BLOOD: Lipase: 18 U/L (ref 11–51)

## 2016-07-01 LAB — CBC
HEMATOCRIT: 42.3 % (ref 35.0–47.0)
HEMOGLOBIN: 14.6 g/dL (ref 12.0–16.0)
MCH: 31.3 pg (ref 26.0–34.0)
MCHC: 34.5 g/dL (ref 32.0–36.0)
MCV: 90.7 fL (ref 80.0–100.0)
Platelets: 198 10*3/uL (ref 150–440)
RBC: 4.67 MIL/uL (ref 3.80–5.20)
RDW: 13.1 % (ref 11.5–14.5)
WBC: 7.1 10*3/uL (ref 3.6–11.0)

## 2016-07-01 LAB — AEROBIC CULTURE W GRAM STAIN (SUPERFICIAL SPECIMEN): Culture: NO GROWTH

## 2016-07-01 LAB — TYPE AND SCREEN
ABO/RH(D): O NEG
Antibody Screen: NEGATIVE

## 2016-07-01 LAB — LACTIC ACID, PLASMA: Lactic Acid, Venous: 0.9 mmol/L (ref 0.5–1.9)

## 2016-07-01 LAB — HCG, QUANTITATIVE, PREGNANCY

## 2016-07-01 MED ORDER — SODIUM CHLORIDE 0.9% FLUSH: 9.0000 mL | INTRAVENOUS | Status: DC | PRN

## 2016-07-01 MED ORDER — HYDROMORPHONE HCL 1 MG/ML IJ SOLN
1.0000 mg | Freq: Once | INTRAMUSCULAR | Status: AC
  Administered 2016-07-01: 1 mg via INTRAVENOUS
  Filled 2016-07-01: qty 1

## 2016-07-01 MED ORDER — MORPHINE SULFATE (PF) 4 MG/ML IV SOLN
4.0000 mg | Freq: Once | INTRAVENOUS | Status: AC
  Administered 2016-07-01: 4 mg via INTRAVENOUS

## 2016-07-01 MED ORDER — HYDROMORPHONE HCL 1 MG/ML IJ SOLN
1.0000 mg | Freq: Once | INTRAMUSCULAR | Status: AC
  Administered 2016-07-01: 1 mg via INTRAVENOUS

## 2016-07-01 MED ORDER — HYDROMORPHONE HCL 1 MG/ML PO LIQD: 1.0000 mg | Freq: Once | ORAL | Status: DC

## 2016-07-01 MED ORDER — LACTATED RINGERS IV SOLN
INTRAVENOUS | Status: DC
  Administered 2016-07-01 – 2016-07-02 (×3): via INTRAVENOUS

## 2016-07-01 MED ORDER — MORPHINE SULFATE (PF) 4 MG/ML IV SOLN
4.0000 mg | Freq: Once | INTRAVENOUS | Status: AC
  Administered 2016-07-01: 4 mg via INTRAVENOUS
  Filled 2016-07-01: qty 1

## 2016-07-01 MED ORDER — HYDROMORPHONE HCL 1 MG/ML IJ SOLN
INTRAMUSCULAR | Status: AC
  Filled 2016-07-01: qty 1

## 2016-07-01 MED ORDER — HYDROMORPHONE 1 MG/ML IV SOLN
INTRAVENOUS | Status: DC
  Administered 2016-07-01: 20:00:00 via INTRAVENOUS
  Administered 2016-07-02: 2.6 mg via INTRAVENOUS
  Administered 2016-07-02: 4.2 mg via INTRAVENOUS
  Administered 2016-07-02: 2.4 mL via INTRAVENOUS
  Filled 2016-07-01 (×2): qty 25

## 2016-07-01 MED ORDER — ONDANSETRON HCL 4 MG/2ML IJ SOLN
4.0000 mg | Freq: Four times a day (QID) | INTRAMUSCULAR | Status: DC | PRN
  Administered 2016-07-01 – 2016-07-02 (×3): 4 mg via INTRAVENOUS
  Filled 2016-07-01 (×2): qty 2

## 2016-07-01 MED ORDER — DIPHENHYDRAMINE HCL 50 MG/ML IJ SOLN: 12.5000 mg | Freq: Four times a day (QID) | INTRAMUSCULAR | Status: DC | PRN

## 2016-07-01 MED ORDER — HYDROMORPHONE 1 MG/ML IV SOLN: INTRAVENOUS | Status: DC

## 2016-07-01 MED ORDER — ONDANSETRON HCL 4 MG/2ML IJ SOLN
INTRAMUSCULAR | Status: AC
  Administered 2016-07-01: 4 mg via INTRAVENOUS
  Filled 2016-07-01: qty 2

## 2016-07-01 MED ORDER — ONDANSETRON HCL 4 MG/2ML IJ SOLN
4.0000 mg | Freq: Once | INTRAMUSCULAR | Status: AC
  Administered 2016-07-01: 4 mg via INTRAVENOUS

## 2016-07-01 MED ORDER — LACTATED RINGERS IV SOLN: INTRAVENOUS | Status: DC

## 2016-07-01 MED ORDER — NALOXONE HCL 0.4 MG/ML IJ SOLN: 0.4000 mg | INTRAMUSCULAR | Status: DC | PRN

## 2016-07-01 MED ORDER — DIPHENHYDRAMINE HCL 12.5 MG/5ML PO ELIX
12.5000 mg | ORAL_SOLUTION | Freq: Four times a day (QID) | ORAL | Status: DC | PRN
  Filled 2016-07-01: qty 5

## 2016-07-01 MED ORDER — MORPHINE SULFATE (PF) 4 MG/ML IV SOLN
INTRAVENOUS | Status: AC
  Administered 2016-07-01: 4 mg via INTRAVENOUS
  Filled 2016-07-01: qty 1

## 2016-07-01 NOTE — ED Triage Notes (Signed)
Pt presents post laparascopic surgery. Pt states she had exploratory surgery and they repaired "hole in my uterus that the fallopian tube was stuck in." Surgery was Monday, 4/23. She awakened this morning with severe pain in her RLQ, as well as n/v/d. Pt actively vomiting during triage, as well as rocking and moaning in pain.
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
## 2016-07-01 NOTE — ED Notes (Signed)
Patient returned from US.

## 2016-07-01 NOTE — ED Notes (Signed)
Patient presents to the ED with severe lower abdominal pain and vaginal bleeding.  Patient had exploratory surgery on Monday and was released from the hospital yesterday.

## 2016-07-01 NOTE — ED Notes (Signed)
Dr. Lenard Lance in room to reassess patient.  Will continue to monitor.

## 2016-07-01 NOTE — ED Notes (Signed)
Pt also states she is passing large amounts of blood in her urine.

## 2016-07-01 NOTE — ED Provider Notes (Signed)
[facility] Emergency Department Provider Note  Time seen: 4:24 PM  I have reviewed the triage vital signs and the nursing notes.   HISTORY  Chief Complaint Abdominal Pain and Emesis    HPI Tara Horton is a 26 y.o. female who presents to the emergency department for significant lower abdominal pain. According to the patient and record review the patient was admitted to the hospital 06/28/16 for lower abdominal pain. Patient was taken to the OR by Dr. Dalbert Garnet for exploratory laparoscopy. Patient found have salpingitis. Patient's pain was controlled and the patient was discharged 06/29/16. Patient states since going home she has continued to have severe pain. She states her pain was never controlled and a center home anyways. Patient was prescribed pain medications but states they're not working so she came to the emergency department for evaluation. Describes her pain as 10/10 and located across the entire lower abdomen. Positive for nausea and vomiting. Was a for diarrhea. Denies any cough congestion chest pain or shortness breath. States subjective fever at home.  History reviewed. No pertinent past medical history.  Patient Active Problem List   Diagnosis Date Noted  . Salpingitis/oophoritis, acute 06/28/2016    Past Surgical History:  Procedure Laterality Date  . DIAGNOSTIC LAPAROSCOPY WITH REMOVAL OF ECTOPIC PREGNANCY N/A 06/28/2016   Procedure: DIAGNOSTIC LAPAROSCOPY;  Surgeon: Christeen Douglas, MD;  Location: [facility] ORS;  Service: Gynecology;  Laterality: N/A;  . TONSILLECTOMY      Prior to Admission medications   Medication Sig Start Date End Date Taking? Authorizing Provider  doxycycline (MONODOX) 100 MG capsule Take 1 capsule (100 mg total) by mouth 2 (two) times daily. 06/30/16  Yes Chelsea C Ward, MD  metroNIDAZOLE (FLAGYL) 500 MG tablet Take 1 tablet (500 mg total) by mouth 3 (three) times daily. 06/30/16  Yes Chelsea C Ward, MD  ondansetron  (ZOFRAN) 4 MG tablet Take 1 tablet (4 mg total) by mouth every 6 (six) hours as needed for nausea. 06/30/16  Yes Chelsea C Ward, MD  oxyCODONE (OXY IR/ROXICODONE) 5 MG immediate release tablet Take 1 tablet (5 mg total) by mouth every 4 (four) hours as needed (pain scale 4 to 7). 06/30/16  Yes Chelsea C Ward, MD  pantoprazole (PROTONIX) 40 MG tablet Take 40 mg by mouth 2 (two) times daily. 06/08/16 06/08/17 Yes Historical Provider, MD    Allergies  Allergen Reactions  . Ibuprofen     gastritis    History reviewed. No pertinent family history.  Social History Social History  Substance Use Topics  . Smoking status: Current Every Day Smoker    Packs/day: 1.00  . Smokeless tobacco: Never Used  . Alcohol use No    Review of Systems Constitutional: Subjective fever ENT: Negative for congestion Cardiovascular: Negative for chest pain. Respiratory: Negative for shortness of breath. Gastrointestinal: Severe lower abdominal pain. Positive for nausea vomiting diarrhea Genitourinary: Negative for dysuria. Musculoskeletal: Negative for back pain. Skin: Negative for rash. Neurological: Negative for headache All other ROS negative  ____________________________________________   PHYSICAL EXAM:  VITAL SIGNS: ED Triage Vitals  Enc Vitals Group     BP 07/01/16 1445 119/76     Pulse Rate 07/01/16 1445 (!) 134     Resp --      Temp 07/01/16 1445 99.1 F (37.3 C)     Temp Source 07/01/16 1445 Oral     SpO2 07/01/16 1445 99 %     Weight 07/01/16 1446 200 lb (90.7 kg)  Height 07/01/16 1446  (1.676 m)     Head Circumference --      Peak Flow --      Pain Score 07/01/16 1445 10     Pain Loc --      Pain Edu? --      Excl. in GC? --     Constitutional: Alert and oriented. Mild distress holding abdomen. Eyes: Normal exam ENT   Head: Normocephalic and atraumatic   Mouth/Throat: Mucous membranes are moist. Cardiovascular: Regular rhythm, rate around 100-120 bpm. No obvious  murmur. Respiratory: Mild tachypnea with the patient is in mild distress due to pain. No obvious wheezes rales or rhonchi. Clear bilaterally. Gastrointestinal: Soft, significant lower abdominal tenderness to palpation, mild diffuse tenderness to palpation. Voluntary guarding to lower abdomen. No rebound. Musculoskeletal: Nontender with normal range of motion in all extremities.  Neurologic:  Normal speech and language. No gross focal neurologic deficits Skin:  Skin is warm, dry and intact.  Psychiatric: Mood and affect are normal.       RADIOLOGY  Ultrasounds are pending.  ____________________________________________   INITIAL IMPRESSION / ASSESSMENT AND PLAN / ED COURSE  Pertinent labs & imaging results that were available during my care of the patient were reviewed by me and considered in my medical decision making (see chart for details).  Patient presents to the emergency department with severe lower abdominal pain and writhing around on the bed in discomfort. Patient was recently admitted by [facility] then have salpingitis on exploratory laparoscopy. Patient states her pain never improved but has worsened. Patient's labs are normal including a normal white blood cell count. We'll obtain ultrasounds of the pelvis to further evaluate. We will discuss with OB for further recommendations.  Dr. Dalbert Garnet has seen the patient and will be taken back to the operating room. Ultrasounds are pending at this point. Labs are normal. Pregnancy is negative.  ____________________________________________   FINAL CLINICAL IMPRESSION(S) / ED DIAGNOSES  Lower abdominal pain    [name], MD 07/01/16 1750

## 2016-07-02 ENCOUNTER — Encounter
Admission: EM | Disposition: A | Discharge status: Home / Self Care | Payer: Self-pay | Source: Home / Self Care | Attending: Emergency Medicine

## 2016-07-02 ENCOUNTER — Inpatient Hospital Stay: Payer: Self-pay | Admitting: Anesthesiology

## 2016-07-02 ENCOUNTER — Encounter: Payer: Self-pay | Admitting: *Deleted

## 2016-07-02 HISTORY — PX: LAPAROSCOPY: SHX197

## 2016-07-02 HISTORY — PX: DILATION AND CURETTAGE OF UTERUS: SHX78

## 2016-07-02 SURGERY — LAPAROSCOPY, DIAGNOSTIC
Anesthesia: General | Laterality: Right | Wound class: Clean Contaminated

## 2016-07-02 MED ORDER — FENTANYL CITRATE (PF) 100 MCG/2ML IJ SOLN
INTRAMUSCULAR | Status: AC
  Administered 2016-07-02: 25 ug via INTRAVENOUS
  Filled 2016-07-02: qty 2

## 2016-07-02 MED ORDER — PROMETHAZINE HCL 25 MG/ML IJ SOLN: 6.2500 mg | INTRAMUSCULAR | Status: DC | PRN

## 2016-07-02 MED ORDER — BUPIVACAINE HCL (PF) 0.5 % IJ SOLN
INTRAMUSCULAR | Status: AC
  Filled 2016-07-02: qty 30

## 2016-07-02 MED ORDER — ONDANSETRON HCL 4 MG/2ML IJ SOLN
INTRAMUSCULAR | Status: AC
  Filled 2016-07-02: qty 2

## 2016-07-02 MED ORDER — DOCUSATE SODIUM 100 MG PO CAPS: 100.0000 mg | ORAL_CAPSULE | Freq: Two times a day (BID) | ORAL | 0 refills | Status: DC

## 2016-07-02 MED ORDER — HYDROMORPHONE HCL 1 MG/ML IJ SOLN: 0.2000 mg | INTRAMUSCULAR | Status: DC | PRN

## 2016-07-02 MED ORDER — FENTANYL CITRATE (PF) 100 MCG/2ML IJ SOLN
INTRAMUSCULAR | Status: AC
  Filled 2016-07-02: qty 2

## 2016-07-02 MED ORDER — ACETAMINOPHEN 10 MG/ML IV SOLN
INTRAVENOUS | Status: DC | PRN
  Administered 2016-07-02: 1000 mg via INTRAVENOUS

## 2016-07-02 MED ORDER — LACTATED RINGERS IV SOLN: INTRAVENOUS | Status: DC

## 2016-07-02 MED ORDER — PANTOPRAZOLE SODIUM 40 MG PO TBEC
40.0000 mg | DELAYED_RELEASE_TABLET | Freq: Two times a day (BID) | ORAL | Status: DC
  Administered 2016-07-02: 40 mg via ORAL
  Filled 2016-07-02: qty 1

## 2016-07-02 MED ORDER — ROCURONIUM BROMIDE 100 MG/10ML IV SOLN
INTRAVENOUS | Status: DC | PRN
  Administered 2016-07-02: 20 mg via INTRAVENOUS
  Administered 2016-07-02: 30 mg via INTRAVENOUS

## 2016-07-02 MED ORDER — DOCUSATE SODIUM 100 MG PO CAPS
100.0000 mg | ORAL_CAPSULE | Freq: Two times a day (BID) | ORAL | Status: DC
  Administered 2016-07-02: 100 mg via ORAL
  Filled 2016-07-02: qty 1

## 2016-07-02 MED ORDER — SUGAMMADEX SODIUM 200 MG/2ML IV SOLN
INTRAVENOUS | Status: AC
  Filled 2016-07-02: qty 2

## 2016-07-02 MED ORDER — PROPOFOL 10 MG/ML IV BOLUS
INTRAVENOUS | Status: AC
  Filled 2016-07-02: qty 20

## 2016-07-02 MED ORDER — DEXAMETHASONE SODIUM PHOSPHATE 10 MG/ML IJ SOLN
INTRAMUSCULAR | Status: DC | PRN
  Administered 2016-07-02: 10 mg via INTRAVENOUS

## 2016-07-02 MED ORDER — METHYLENE BLUE 0.5 % INJ SOLN
INTRAVENOUS | Status: AC
  Filled 2016-07-02: qty 10

## 2016-07-02 MED ORDER — FENTANYL CITRATE (PF) 100 MCG/2ML IJ SOLN
INTRAMUSCULAR | Status: DC | PRN
  Administered 2016-07-02 (×3): 50 ug via INTRAVENOUS

## 2016-07-02 MED ORDER — ACETAMINOPHEN 10 MG/ML IV SOLN
INTRAVENOUS | Status: AC
  Filled 2016-07-02: qty 100

## 2016-07-02 MED ORDER — FENTANYL CITRATE (PF) 100 MCG/2ML IJ SOLN
25.0000 ug | INTRAMUSCULAR | Status: DC | PRN
  Administered 2016-07-02: 25 ug via INTRAVENOUS
  Administered 2016-07-02: 50 ug via INTRAVENOUS
  Administered 2016-07-02: 25 ug via INTRAVENOUS

## 2016-07-02 MED ORDER — MENTHOL 3 MG MT LOZG: 1.0000 | LOZENGE | OROMUCOSAL | Status: DC | PRN

## 2016-07-02 MED ORDER — ONDANSETRON HCL 4 MG PO TABS: 4.0000 mg | ORAL_TABLET | Freq: Four times a day (QID) | ORAL | Status: DC | PRN

## 2016-07-02 MED ORDER — OXYCODONE HCL 5 MG PO TABS
5.0000 mg | ORAL_TABLET | ORAL | Status: DC | PRN
  Administered 2016-07-02: 5 mg via ORAL
  Filled 2016-07-02: qty 1

## 2016-07-02 MED ORDER — SUGAMMADEX SODIUM 200 MG/2ML IV SOLN
INTRAVENOUS | Status: DC | PRN
  Administered 2016-07-02: 200 mg via INTRAVENOUS

## 2016-07-02 MED ORDER — PROPOFOL 10 MG/ML IV BOLUS
INTRAVENOUS | Status: DC | PRN
  Administered 2016-07-02: 200 mg via INTRAVENOUS

## 2016-07-02 MED ORDER — LIDOCAINE HCL (CARDIAC) 20 MG/ML IV SOLN
INTRAVENOUS | Status: DC | PRN
  Administered 2016-07-02: 100 mg via INTRAVENOUS

## 2016-07-02 MED ORDER — MIDAZOLAM HCL 2 MG/2ML IJ SOLN
INTRAMUSCULAR | Status: AC
  Filled 2016-07-02: qty 2

## 2016-07-02 MED ORDER — BUPIVACAINE HCL 0.5 % IJ SOLN
INTRAMUSCULAR | Status: DC | PRN
  Administered 2016-07-02: 10 mL

## 2016-07-02 MED ORDER — DEXAMETHASONE SODIUM PHOSPHATE 10 MG/ML IJ SOLN
INTRAMUSCULAR | Status: AC
  Filled 2016-07-02: qty 1

## 2016-07-02 MED ORDER — SUCCINYLCHOLINE CHLORIDE 20 MG/ML IJ SOLN
INTRAMUSCULAR | Status: AC
  Filled 2016-07-02: qty 1

## 2016-07-02 MED ORDER — ROCURONIUM BROMIDE 50 MG/5ML IV SOLN
INTRAVENOUS | Status: AC
  Filled 2016-07-02: qty 1

## 2016-07-02 MED ORDER — METRONIDAZOLE 500 MG PO TABS
500.0000 mg | ORAL_TABLET | Freq: Three times a day (TID) | ORAL | Status: DC
  Administered 2016-07-02: 500 mg via ORAL
  Filled 2016-07-02 (×2): qty 1

## 2016-07-02 MED ORDER — SILVER NITRATE-POT NITRATE 75-25 % EX MISC
CUTANEOUS | Status: AC
  Filled 2016-07-02: qty 1

## 2016-07-02 MED ORDER — SUCCINYLCHOLINE CHLORIDE 20 MG/ML IJ SOLN
INTRAMUSCULAR | Status: DC | PRN
  Administered 2016-07-02: 100 mg via INTRAVENOUS

## 2016-07-02 MED ORDER — OXYCODONE-ACETAMINOPHEN 5-325 MG PO TABS: 1.0000 | ORAL_TABLET | ORAL | 0 refills | Status: DC | PRN

## 2016-07-02 MED ORDER — MIDAZOLAM HCL 2 MG/2ML IJ SOLN
INTRAMUSCULAR | Status: DC | PRN
  Administered 2016-07-02: 2 mg via INTRAVENOUS

## 2016-07-02 MED ORDER — SEVOFLURANE IN SOLN
RESPIRATORY_TRACT | Status: AC
  Filled 2016-07-02: qty 250

## 2016-07-02 MED ORDER — DOXYCYCLINE HYCLATE 100 MG PO TABS
100.0000 mg | ORAL_TABLET | Freq: Two times a day (BID) | ORAL | Status: DC
  Administered 2016-07-02: 100 mg via ORAL
  Filled 2016-07-02 (×2): qty 1

## 2016-07-02 MED ORDER — LIDOCAINE HCL (PF) 2 % IJ SOLN
INTRAMUSCULAR | Status: AC
  Filled 2016-07-02: qty 2

## 2016-07-02 SURGICAL SUPPLY — 42 items
BAG URO DRAIN 2000ML W/SPOUT (MISCELLANEOUS) ×4 IMPLANT
BLADE SURG SZ11 CARB STEEL (BLADE) ×4 IMPLANT
CATH FOLEY 2WAY  5CC 16FR (CATHETERS) ×2
CATH ROBINSON RED A/P 16FR (CATHETERS) ×4 IMPLANT
CATH URTH 16FR FL 2W BLN LF (CATHETERS) ×2 IMPLANT
CHLORAPREP W/TINT 26ML (MISCELLANEOUS) ×4 IMPLANT
CLOSURE WOUND 1/4X4 (GAUZE/BANDAGES/DRESSINGS) ×1
COVER LIGHT HANDLE STERIS (MISCELLANEOUS) ×4 IMPLANT
DERMABOND ADVANCED (GAUZE/BANDAGES/DRESSINGS) ×2
DERMABOND ADVANCED .7 DNX12 (GAUZE/BANDAGES/DRESSINGS) ×2 IMPLANT
DRSG TEGADERM 2-3/8X2-3/4 SM (GAUZE/BANDAGES/DRESSINGS) ×12 IMPLANT
ENDOPOUCH RETRIEVER 10 (MISCELLANEOUS) ×4 IMPLANT
GAUZE SPONGE NON-WVN 2X2 STRL (MISCELLANEOUS) ×4 IMPLANT
GLOVE BIO SURGEON STRL SZ7 (GLOVE) ×8 IMPLANT
GLOVE INDICATOR 7.5 STRL GRN (GLOVE) ×4 IMPLANT
GOWN STRL REUS W/ TWL LRG LVL3 (GOWN DISPOSABLE) ×4 IMPLANT
GOWN STRL REUS W/TWL LRG LVL3 (GOWN DISPOSABLE) ×4
IRRIGATION STRYKERFLOW (MISCELLANEOUS) ×2 IMPLANT
IRRIGATOR STRYKERFLOW (MISCELLANEOUS) ×4
IV NS 1000ML (IV SOLUTION) ×2
IV NS 1000ML BAXH (IV SOLUTION) ×2 IMPLANT
KIT RM TURNOVER CYSTO AR (KITS) ×4 IMPLANT
LABEL OR SOLS (LABEL) ×4 IMPLANT
LIGASURE LAP MARYLAND 5MM 37CM (ELECTROSURGICAL) ×4 IMPLANT
NS IRRIG 500ML POUR BTL (IV SOLUTION) ×4 IMPLANT
PACK DNC HYST (MISCELLANEOUS) ×4 IMPLANT
PACK GYN LAPAROSCOPIC (MISCELLANEOUS) ×4 IMPLANT
PAD OB MATERNITY 4.3X12.25 (PERSONAL CARE ITEMS) ×4 IMPLANT
PAD PREP 24X41 OB/GYN DISP (PERSONAL CARE ITEMS) ×4 IMPLANT
SCISSORS METZENBAUM CVD 33 (INSTRUMENTS) ×4 IMPLANT
SLEEVE ENDOPATH XCEL 5M (ENDOMECHANICALS) ×4 IMPLANT
SPONGE VERSALON 2X2 STRL (MISCELLANEOUS) ×4
STRIP CLOSURE SKIN 1/4X4 (GAUZE/BANDAGES/DRESSINGS) ×3 IMPLANT
SUT MNCRL AB 4-0 PS2 18 (SUTURE) ×4 IMPLANT
SUT VIC AB 2-0 UR6 27 (SUTURE) ×4 IMPLANT
SUT VIC AB 4-0 SH 27 (SUTURE) ×2
SUT VIC AB 4-0 SH 27XANBCTRL (SUTURE) ×2 IMPLANT
SWABSTK COMLB BENZOIN TINCTURE (MISCELLANEOUS) ×4 IMPLANT
TROCAR ENDO BLADELESS 11MM (ENDOMECHANICALS) ×4 IMPLANT
TROCAR XCEL NON-BLD 5MMX100MML (ENDOMECHANICALS) ×4 IMPLANT
TROCAR XCEL UNIV SLVE 11M 100M (ENDOMECHANICALS) ×4 IMPLANT
TUBING INSUFFLATOR HI FLOW (MISCELLANEOUS) ×4 IMPLANT

## 2016-07-02 NOTE — Progress Notes (Signed)
Discharge order received from doctor. Reviewed discharge instructions and prescriptions with patient and answered all questions. Follow up appointment given. Work note given to patient. Patient verbalized understanding. Patient discharged home in stable condition via wheelchair by nursing/auxillary.   Oswald Hillock, RN

## 2016-07-02 NOTE — Anesthesia Preprocedure Evaluation (Signed)
Anesthesia Evaluation  Patient identified by MRN, date of birth, ID band Patient awake    Reviewed: Allergy & Precautions, NPO status , Patient's Chart, lab work & pertinent test results  History of Anesthesia Complications Negative for: history of anesthetic complications  Airway Mallampati: II  TM Distance: >3 FB Neck ROM: Full    Dental  (+) Missing, Poor Dentition   Pulmonary neg sleep apnea, neg COPD, Current Smoker,    breath sounds clear to auscultation- rhonchi (-) wheezing      Cardiovascular Exercise Tolerance: Good (-) hypertension(-) CAD and (-) Past MI  Rhythm:Regular Rate:Normal - Systolic murmurs and - Diastolic murmurs    Neuro/Psych negative neurological ROS  negative psych ROS   GI/Hepatic negative GI ROS, Neg liver ROS,   Endo/Other  negative endocrine ROSneg diabetes  Renal/GU negative Renal ROS     Musculoskeletal negative musculoskeletal ROS (+)   Abdominal (+) + obese,   Peds  Hematology negative hematology ROS (+)   Anesthesia Other Findings History reviewed. No pertinent past medical history.   Reproductive/Obstetrics                             Anesthesia Physical  Anesthesia Plan  ASA: II  Anesthesia Plan: General   Post-op Pain Management:    Induction: Intravenous, Rapid sequence and Cricoid pressure planned  Airway Management Planned: Oral ETT  Additional Equipment:   Intra-op Plan:   Post-operative Plan: Extubation in OR  Informed Consent: I have reviewed the patients History and Physical, chart, labs and discussed the procedure including the risks, benefits and alternatives for the proposed anesthesia with the patient or authorized representative who has indicated his/her understanding and acceptance.   Dental advisory given  Plan Discussed with: CRNA and Anesthesiologist  Anesthesia Plan Comments:         Anesthesia Quick  Evaluation

## 2016-07-02 NOTE — Anesthesia Postprocedure Evaluation (Signed)
Anesthesia Post Note  Patient: Tara Horton  Procedure(s) Performed: Procedure(s) (LRB): LAPAROSCOPY DIAGNOSTIC (Right) DILATATION AND CURETTAGE (N/A)  Patient location during evaluation: PACU Anesthesia Type: General Level of consciousness: awake and alert Pain management: pain level controlled Vital Signs Assessment: post-procedure vital signs reviewed and stable Respiratory status: spontaneous breathing, nonlabored ventilation, respiratory function stable and patient connected to nasal cannula oxygen Cardiovascular status: blood pressure returned to baseline and stable Postop Assessment: no signs of nausea or vomiting Anesthetic complications: no     Last Vitals:  Vitals:   07/02/16 1139 07/02/16 1155  BP: (!) 147/100 132/70  Pulse: 72 68  Resp: (!) 21 19  Temp:      Last Pain:  Vitals:   07/02/16 1155  TempSrc:   PainSc: 4                  Lenard Simmer

## 2016-07-02 NOTE — Anesthesia Procedure Notes (Signed)
Procedure Name: Intubation Date/Time: 07/02/2016 9:57 AM Performed by: Aline Brochure Pre-anesthesia Checklist: Patient identified, Emergency Drugs available, Suction available and Patient being monitored Patient Re-evaluated:Patient Re-evaluated prior to inductionOxygen Delivery Method: Circle system utilized Preoxygenation: Pre-oxygenation with 100% oxygen Intubation Type: IV induction, Rapid sequence and Cricoid Pressure applied Laryngoscope Size: Mac and 3 Grade View: Grade I Tube type: Oral Tube size: 7.0 mm Number of attempts: 1 Airway Equipment and Method: Stylet Placement Confirmation: ETT inserted through vocal cords under direct vision,  positive ETCO2 and breath sounds checked- equal and bilateral Secured at: 21 cm Tube secured with: Tape Dental Injury: Teeth and Oropharynx as per pre-operative assessment

## 2016-07-02 NOTE — Anesthesia Post-op Follow-up Note (Cosign Needed)
Anesthesia QCDR form completed.        

## 2016-07-02 NOTE — Discharge Instructions (Signed)
Laparoscopic Tubal Removal Discharge Instructions Laparoscopic tubal removal is a procedure that removes the fallopian tube containing the ectopic pregnancy. RISKS AND COMPLICATIONS   Infection.  Bleeding.  Injury to surrounding organs.  Anesthetic side effects.  Failure of the procedure.  Risks of future ectopic pregnancy on the other side PROCEDURE   You may be given a medicine to help you relax (sedative) before the procedure. You will be given a medicine to make you sleep (general anesthetic) during the procedure.  A tube will be put down your throat to help your breath while under general anesthesia.  Two small cuts (incisions) are made in the lower abdominal area and one incision is made near the belly button.  Your abdominal area will be inflated with a safe gas (carbon dioxide). This helps give the surgeon room to operate, visualize, and helps the surgeon avoid other organs.  A thin, lighted tube (laparoscope) with a camera attached is inserted into your abdomen through the incision near the belly button. Other small instruments are also inserted through the other abdominal incisions.  The fallopian tube is located and are removed.  After the fallopian tube is removed, the gas is released from the abdomen.  The incisions will be closed with stitches (sutures), and Dermabond. A bandage may be placed over the incisions. AFTER THE PROCEDURE   You will also have some mild abdominal discomfort for 3-7 days. You will be given pain medicine to ease any discomfort.  As long as there are no problems, you may be allowed to go home. Someone will need to drive you home and be with you for at least 24 hours once home.  You may have some mild discomfort in the throat. This is from the tube placed in your throat while you were sleeping.  You may experience discomfort in the shoulder area from some trapped air between the liver and diaphragm. This sensation is normal and will slowly  go away on its own. HOME CARE INSTRUCTIONS   Take all medicines as directed.  Only take over-the-counter or prescription medicines for pain, discomfort, or fever as directed by your caregiver.  Resume daily activities as directed.  Showers are preferred over baths.  You may resume sexual activities in 1 week or as directed.  Do not drive while taking narcotics. SEEK MEDICAL CARE IF: .  There is increasing abdominal pain.  You feel lightheaded or faint.  You have the chills.  You have an oral temperature above 102 F (38.9 C).  There is pus-like (purulent) drainage from any of the wounds.  You are unable to pass gas or have a bowel movement.  You feel sick to your stomach (nauseous) or throw up (vomit). MAKE SURE YOU:   Understand these instructions.  Will watch your condition.  Will get help right away if you are not doing well or get worse.  ExitCare Patient Information 2013 [place], [place].

## 2016-07-02 NOTE — Transfer of Care (Signed)
Immediate Anesthesia Transfer of Care Note  Patient: Tara Horton  Procedure(s) Performed: Procedure(s): LAPAROSCOPY DIAGNOSTIC (Right) DILATATION AND CURETTAGE (N/A)  Patient Location: PACU  Anesthesia Type:General  Level of Consciousness: awake, alert  and oriented  Airway & Oxygen Therapy: Patient connected to face mask oxygen  Post-op Assessment: Post -op Vital signs reviewed and stable  Post vital signs: stable  Last Vitals:  Vitals:   07/02/16 0910 07/02/16 1110  BP: (!) 131/94 121/90  Pulse: 95 (!) 105  Resp: (!) 22 19  Temp: (!) 35.9 C     Last Pain:  Vitals:   07/02/16 0910  TempSrc: Oral  PainSc: 10-Worst pain ever         Complications: No apparent anesthesia complications

## 2016-07-02 NOTE — Op Note (Signed)
Tara Horton PROCEDURE DATE: 07/01/2016 - 07/02/2016  PREOPERATIVE DIAGNOSIS: ACute pelvic pain, right salpingitis, AUB POSTOPERATIVE DIAGNOSIS: same PROCEDURE: Diagnostic laparoscopy, D&C, right salpingectomy SURGEON:  Dr. Christeen Douglas ASSISTANT: CST ANESTHESIOLOGIST: Lenard Simmer, MD Anesthesiologist: Lenard Simmer, MD CRNA: Irving Burton, CRNA  INDICATIONS: 26 y.o.  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   Please see preoperative notes for further details.  Risks of surgery were discussed with the patient including but not limited to: bleeding which may require transfusion or reoperation; infection which may require antibiotics; injury to bowel, bladder, ureters or other surrounding organs; need for additional procedures including laparotomy; thromboembolic phenomenon, incisional problems and other postoperative/anesthesia complications. Written informed consent was obtained.    FINDINGS:  FINDINGS:  Small uterus, normal left ovary and fallopian tube. Right fallopian tube with evidence of edematous inflammatory exudate in isthmic portion of tube and edematous fibriae, with edematous appearing exudate on right ovary but no ovarian pathology. OF NOTE,  the posterior cul-de-sac window has resolved and is covered with fibrinous exudate and peritoneum. There was a small reactionary area on the sigmoid colon adjacent to the tube. No other abdominal/pelvic abnormality.  Normal upper abdomen; specifically, no evidence of Fitz-Hugh-Curtis syndrome. No Hemoperitoneum. Normal appendix. No tubo-ovarian abscess.  ANESTHESIA:    General INTRAVENOUS FLUIDS: 500 ml ESTIMATED BLOOD LOSS: 50 ml URINE OUTPUT: 100 ml SPECIMENS: Endometrial curettings; right fallopian tube  COMPLICATIONS: None immediate  PROCEDURE IN DETAIL:  The patient had sequential compression devices applied to her lower  extremities while in the preoperative area.  She was then taken to the operating room where general anesthesia was administered and was found to be adequate.  She was placed in the dorsal lithotomy position, and was prepped and draped in a sterile manner. Urine collection was taken for UA due to dysuria. Her bladder was catheterized for an estimated amount of clear, yellow urine. A weighed speculum was then placed in the patient's vagina and a single tooth tenaculum was applied to the anterior lip of the cervix.  Her cervix was serially dilated to 15 [place] using Hanks dilators. An ECC was performed. The hysteroscope was introduced to reveal the above findings. A sharp curettage was then performed until there was a gritty texture in all four quadrants. An acorn was advanced for uterine manipulation and attention turned to the abdomen.  Llocal anesthetic with 0.5% Marcaine was placed, attention was turned to the abdomen where an umbilical incision was made with the scalpel.  The Optiview 5-mm trocar and sleeve were then advanced without difficulty with the laparoscope under direct visualization into the abdomen.  The abdomen was then insufflated with carbon dioxide gas and adequate pneumoperitoneum was obtained.   A detailed survey of the patient's pelvis and abdomen revealed the findings as mentioned above. Two additional 5mm trocars were placed in the right and left lower quadrant under direct visualization.  The findings were noted as above. Pictures were taken of all quadrants. The pelvis was irrigated and all fluid and blood removed. The right-sided fallopian tube was removed along the mesosalpinx with the LigaSure electrocautery device. The pelvis was again irrigated until the irrigant was clear. The abdomen was desufflated to 7 mm meters of mercury and no bleeding from the right adnexa was noted. The patient was placed in reverse Trendelenburg and all fluid removed.  No intraoperative injury to  surrounding organs was noted.   Pictures were taken  of the quadrants and pelvis. The abdomen was desufflated and all instruments were then removed from the patient's abdomen. The uterine manipulator was removed without complications.  All incisions were closed with  Dermabond.   The patient tolerated the procedures well.  All instruments, needles, and sponge counts were correct x 2. The patient was taken to the recovery room in stable condition.

## 2016-07-03 LAB — HIV ANTIBODY (ROUTINE TESTING W REFLEX): HIV Screen 4th Generation wRfx: NONREACTIVE

## 2016-07-04 LAB — ANAEROBIC CULTURE

## 2016-07-05 LAB — SURGICAL PATHOLOGY

## 2016-07-05 NOTE — Discharge Summary (Signed)
GynecologicalDischarge Summary  Patient Name: Tara Horton DOB: [birth year] MRN: [id number]  Date of Admission: 07/01/2016 Date of Discharge: 07/05/2016  Hospital course:   The patient was admitted for pain control until OR available to perform surgery and proceeded to the operating room as scheduled for the below stated procedure without complications, EBL 50cc.  (For complete operative information, please see operative report).  Postoperatively she was admitted to the floor. Pain meds transitioned to PO pod#0 when the patient was tolerating a regular diet on postoperative day number 0.  Patient was felt to be stable for discharge on postoperative day number 0 when she was tolerating a regular diet, pain was controlled with po pain medications, and she was ambulating and voiding without difficulty. Vital signs were stable and physical exam remained benign throughout her hospital stay. Incision was clean,dry, and intact.  *.  Rx given for Percocet, Zofran, and Colace .    She was given specific instructions and numbers to call in written and verbal format. She verbalized understanding, agrees with the plan of care, and all questions answered to her satisfaction.  Discharge Physical Exam:  BP 127/68 (BP Location: Left Arm)   Pulse (!) 58   Temp 98.4 F (36.9 C) (Oral)   Resp 16   Ht  (1.676 m)   Wt 200 lb (90.7 kg)   LMP 06/27/2016 (Approximate)   SpO2 97%   BMI 32.28 kg/m   General: NAD CV: RRR Pulm: CTABL, nl effort ABD: s/nd/nt Incision: c/d/i  DVT Evaluation: LE non-ttp, no evidence of DVT on exam.  Hemoglobin  Date Value Ref Range Status  07/01/2016 14.6 12.0 - 16.0 g/dL Final   HCT  Date Value Ref Range Status  07/01/2016 42.3 35.0 - 47.0 % Final      Plan:  Deaisha Welborn was discharged to home in good condition. Follow-up appointment at [facility] in 2 weeks   Discharge Medications: Allergies as of 07/02/2016      Reactions   Ibuprofen    gastritis      Medication List    TAKE these medications   docusate sodium 100 MG capsule Commonly known as:  COLACE Take 1 capsule (100 mg total) by mouth 2 (two) times daily. To keep stools soft   doxycycline 100 MG capsule Commonly known as:  MONODOX Take 1 capsule (100 mg total) by mouth 2 (two) times daily.   metroNIDAZOLE 500 MG tablet Commonly known as:  FLAGYL Take 1 tablet (500 mg total) by mouth 3 (three) times daily.   ondansetron 4 MG tablet Commonly known as:  ZOFRAN Take 1 tablet (4 mg total) by mouth every 6 (six) hours as needed for nausea.   oxyCODONE 5 MG immediate release tablet Commonly known as:  Oxy IR/ROXICODONE Take 1 tablet (5 mg total) by mouth every 4 (four) hours as needed (pain scale 4 to 7).   oxyCODONE-acetaminophen 5-325 MG tablet Commonly known as:  PERCOCET/ROXICET Take 1 tablet by mouth every 4 (four) hours as needed for severe pain.   pantoprazole 40 MG tablet Commonly known as:  PROTONIX Take 40 mg by mouth 2 (two) times daily.       Follow-up Information    Christeen Douglas, MD. Go on 07/14/2016.   Specialty:  Obstetrics and Gynecology Why:  Follow up appointment on Wednesday May 9th at 10:15 AM Contact information: [address] [place] [address] [phone]           Signed:  Christeen Douglas, MD 6:54 PM

## 2016-08-16 DIAGNOSIS — I10 Essential (primary) hypertension: Secondary | ICD-10-CM | POA: Insufficient documentation

## 2016-08-16 DIAGNOSIS — F17210 Nicotine dependence, cigarettes, uncomplicated: Secondary | ICD-10-CM | POA: Insufficient documentation

## 2016-08-16 DIAGNOSIS — R0789 Other chest pain: Secondary | ICD-10-CM | POA: Insufficient documentation

## 2016-08-16 DIAGNOSIS — Z79899 Other long term (current) drug therapy: Secondary | ICD-10-CM | POA: Insufficient documentation

## 2016-08-16 DIAGNOSIS — J208 Acute bronchitis due to other specified organisms: Secondary | ICD-10-CM | POA: Insufficient documentation

## 2016-08-16 DIAGNOSIS — R042 Hemoptysis: Secondary | ICD-10-CM | POA: Insufficient documentation

## 2016-08-17 ENCOUNTER — Encounter (HOSPITAL_COMMUNITY): Payer: Self-pay | Admitting: Emergency Medicine

## 2016-08-17 ENCOUNTER — Emergency Department (HOSPITAL_COMMUNITY)
Admission: EM | Admit: 2016-08-17 | Discharge: 2016-08-17 | Disposition: A | Discharge status: Home / Self Care | Payer: Self-pay | Attending: Emergency Medicine | Admitting: Emergency Medicine

## 2016-08-17 ENCOUNTER — Emergency Department (HOSPITAL_COMMUNITY): Payer: Self-pay

## 2016-08-17 DIAGNOSIS — J209 Acute bronchitis, unspecified: Secondary | ICD-10-CM

## 2016-08-17 DIAGNOSIS — J208 Acute bronchitis due to other specified organisms: Secondary | ICD-10-CM

## 2016-08-17 DIAGNOSIS — R0789 Other chest pain: Secondary | ICD-10-CM

## 2016-08-17 HISTORY — DX: Bronchitis, not specified as acute or chronic: J40

## 2016-08-17 HISTORY — DX: Essential (primary) hypertension: I10

## 2016-08-17 LAB — CBC WITH DIFFERENTIAL/PLATELET
Basophils Absolute: 0 10*3/uL (ref 0.0–0.1)
Basophils Relative: 0 %
EOS ABS: 0 10*3/uL (ref 0.0–0.7)
EOS PCT: 0 %
HCT: 39.2 % (ref 36.0–46.0)
HEMOGLOBIN: 13.8 g/dL (ref 12.0–15.0)
LYMPHS ABS: 1.9 10*3/uL (ref 0.7–4.0)
Lymphocytes Relative: 19 %
MCH: 32.3 pg (ref 26.0–34.0)
MCHC: 35.2 g/dL (ref 30.0–36.0)
MCV: 91.8 fL (ref 78.0–100.0)
Monocytes Absolute: 0.7 10*3/uL (ref 0.1–1.0)
Monocytes Relative: 7 %
Neutro Abs: 7.4 10*3/uL (ref 1.7–7.7)
Neutrophils Relative %: 74 %
PLATELETS: 163 10*3/uL (ref 150–400)
RBC: 4.27 MIL/uL (ref 3.87–5.11)
RDW: 13.3 % (ref 11.5–15.5)
WBC: 10 10*3/uL (ref 4.0–10.5)

## 2016-08-17 LAB — BASIC METABOLIC PANEL
Anion gap: 10 (ref 5–15)
BUN: 11 mg/dL (ref 6–20)
CHLORIDE: 105 mmol/L (ref 101–111)
CO2: 22 mmol/L (ref 22–32)
Calcium: 9.2 mg/dL (ref 8.9–10.3)
Creatinine, Ser: 0.73 mg/dL (ref 0.44–1.00)
GFR calc Af Amer: >60 mL/min (ref >60)
GFR calc non Af Amer: >60 mL/min (ref >60)
GLUCOSE: 103 mg/dL — AB (ref 65–99)
POTASSIUM: 3.7 mmol/L (ref 3.5–5.1)
SODIUM: 137 mmol/L (ref 135–145)

## 2016-08-17 MED ORDER — HYDROCOD POLST-CPM POLST ER 10-8 MG/5ML PO SUER: 5.0000 mL | Freq: Two times a day (BID) | ORAL | 0 refills | Status: DC | PRN

## 2016-08-17 MED ORDER — ONDANSETRON HCL 4 MG/2ML IJ SOLN
4.0000 mg | Freq: Once | INTRAMUSCULAR | Status: AC
  Administered 2016-08-17: 4 mg via INTRAVENOUS
  Filled 2016-08-17: qty 2

## 2016-08-17 MED ORDER — IOPAMIDOL (ISOVUE-370) INJECTION 76%
100.0000 mL | Freq: Once | INTRAVENOUS | Status: AC | PRN
  Administered 2016-08-17: 100 mL via INTRAVENOUS

## 2016-08-17 MED ORDER — HYDROCOD POLST-CPM POLST ER 10-8 MG/5ML PO SUER
5.0000 mL | Freq: Once | ORAL | Status: AC
  Administered 2016-08-17: 5 mL via ORAL
  Filled 2016-08-17: qty 5

## 2016-08-17 MED ORDER — SODIUM CHLORIDE 0.9 % IV SOLN
Freq: Once | INTRAVENOUS | Status: AC
  Administered 2016-08-17: 02:00:00 via INTRAVENOUS

## 2016-08-17 MED ORDER — IOPAMIDOL (ISOVUE-370) INJECTION 76%
INTRAVENOUS | Status: AC
  Administered 2016-08-17: 100 mL via INTRAVENOUS
  Filled 2016-08-17: qty 100

## 2016-08-17 MED ORDER — ALBUTEROL SULFATE HFA 108 (90 BASE) MCG/ACT IN AERS
2.0000 | INHALATION_SPRAY | RESPIRATORY_TRACT | Status: DC | PRN
  Administered 2016-08-17: 2 via RESPIRATORY_TRACT
  Filled 2016-08-17: qty 6.7

## 2016-08-17 MED ORDER — AZITHROMYCIN 250 MG PO TABS: ORAL_TABLET | ORAL | 0 refills | Status: DC

## 2016-08-17 MED ORDER — FENTANYL CITRATE (PF) 100 MCG/2ML IJ SOLN
100.0000 ug | Freq: Once | INTRAMUSCULAR | Status: AC
  Administered 2016-08-17: 100 ug via INTRAVENOUS
  Filled 2016-08-17: qty 2

## 2016-08-17 NOTE — ED Provider Notes (Signed)
[facility]-EMERGENCY DEPT Provider Note: Lowella Dell, MD, FACEP  CSN: [id number] MRN: [id number] ARRIVAL: 08/16/16 at 2345 ROOM: WA24/WA24   CHIEF COMPLAINT  Hemoptysis   HISTORY OF PRESENT ILLNESS  Tara Horton is a 26 y.o. female with a 2 day history of chest pain. The pain is located in her anterior chest and is described as sharp. The pain worsened yesterday and is described as severe. It is worse with coughing or deep breathing. Since yesterday she has had a cough which is worsened throughout the day. She developed gross hematuria yesterday evening and she was advised to come to the ED. She is not aware of being febrile and was not febrile on arrival. She was noted to be tachycardic and tachypneic on arrival. She feels short of breath but has not been wheezing. She does have some throat discomfort. She has had some posttussive emesis but no other nausea, vomiting or diarrhea. She denies abdominal pain.   Past Medical History:  Diagnosis Date  . Bronchitis   . Hypertension     Past Surgical History:  Procedure Laterality Date  . DIAGNOSTIC LAPAROSCOPY WITH REMOVAL OF ECTOPIC PREGNANCY N/A 06/28/2016   Procedure: DIAGNOSTIC LAPAROSCOPY;  Surgeon: Christeen Douglas, MD;  Location: [facility] ORS;  Service: Gynecology;  Laterality: N/A;  . DILATION AND CURETTAGE OF UTERUS N/A 07/02/2016   Procedure: DILATATION AND CURETTAGE;  Surgeon: Christeen Douglas, MD;  Location: [facility] ORS;  Service: Gynecology;  Laterality: N/A;  . LAPAROSCOPY Right 07/02/2016   Procedure: LAPAROSCOPY DIAGNOSTIC;  Surgeon: Christeen Douglas, MD;  Location: [facility] ORS;  Service: Gynecology;  Laterality: Right;  . TONSILLECTOMY      History reviewed. No pertinent family history.  Social History  Substance Use Topics  . Smoking status: Current Every Day Smoker    Packs/day: 1.00  . Smokeless tobacco: Never Used  . Alcohol use No    Prior to Admission medications   Medication Sig Start Date End Date Taking?  Authorizing Provider  carvedilol (COREG) 25 MG tablet Take 25 mg by mouth 2 (two) times daily with a meal.   Yes [provider]  docusate sodium (COLACE) 100 MG capsule Take 1 capsule (100 mg total) by mouth 2 (two) times daily. To keep stools soft Patient not taking: Reported on 08/17/2016 07/02/16   Christeen Douglas, MD  doxycycline (MONODOX) 100 MG capsule Take 1 capsule (100 mg total) by mouth 2 (two) times daily. Patient not taking: Reported on 08/17/2016 06/30/16   Ward, Elenora Fender, MD  metroNIDAZOLE (FLAGYL) 500 MG tablet Take 1 tablet (500 mg total) by mouth 3 (three) times daily. Patient not taking: Reported on 08/17/2016 06/30/16   Ward, Elenora Fender, MD  ondansetron (ZOFRAN) 4 MG tablet Take 1 tablet (4 mg total) by mouth every 6 (six) hours as needed for nausea. Patient not taking: Reported on 08/17/2016 06/30/16   Ward, Elenora Fender, MD  oxyCODONE (OXY IR/ROXICODONE) 5 MG immediate release tablet Take 1 tablet (5 mg total) by mouth every 4 (four) hours as needed (pain scale 4 to 7). Patient not taking: Reported on 08/17/2016 06/30/16   Ward, Elenora Fender, MD  oxyCODONE-acetaminophen (PERCOCET/ROXICET) 5-325 MG tablet Take 1 tablet by mouth every 4 (four) hours as needed for severe pain. Patient not taking: Reported on 08/17/2016 07/02/16   Christeen Douglas, MD    Allergies Novocain [procaine] and Ibuprofen   REVIEW OF SYSTEMS  Negative except as noted here or in the History of Present Illness.   PHYSICAL EXAMINATION  Initial Vital Signs Blood pressure (!) 163/101, pulse (!) 115, temperature 98.8 F (37.1 C), temperature source Oral, resp. rate (!) 22, height 5\' 6"  (1.676 m), weight 98.4 kg (217 lb), SpO2 98 %.  Examination General: Well-developed, well-nourished female in no acute distress; appearance consistent with age of record HENT: normocephalic; atraumatic; no pharyngeal erythema, edema or exudate Eyes: pupils equal, round and reactive to light; extraocular muscles  intact Neck: supple Heart: regular rate and rhythm; tachycardia Lungs: clear to auscultation bilaterally; persistent cough Chest: Anterior chest wall tenderness without deformity or crepitus Abdomen: soft; nondistended; nontender; no masses or hepatosplenomegaly; bowel sounds present Extremities: No deformity; full range of motion; pulses normal Neurologic: Awake, alert and oriented; motor function intact in all extremities and symmetric; no facial droop Skin: Warm and dry Psychiatric: Normal mood and affect   RESULTS  Summary of this visit's results, reviewed by myself:   EKG Interpretation  Date/Time:    Ventricular Rate:    PR Interval:    QRS Duration:   QT Interval:    QTC Calculation:   R Axis:     Text Interpretation:        Laboratory Studies: Results for orders placed or performed during the hospital encounter of 08/17/16 (from the past 24 hour(s))  CBC with Differential/Platelet     Status: None   Collection Time: 08/17/16  1:18 AM  Result Value Ref Range   WBC 10.0 4.0 - 10.5 K/uL   RBC 4.27 3.87 - 5.11 MIL/uL   Hemoglobin 13.8 12.0 - 15.0 g/dL   HCT 16.139.2 09.636.0 - 04.546.0 %   MCV 91.8 78.0 - 100.0 fL   MCH 32.3 26.0 - 34.0 pg   MCHC 35.2 30.0 - 36.0 g/dL   RDW 40.913.3 81.111.5 - 91.415.5 %   Platelets 163 150 - 400 K/uL   Neutrophils Relative % 74 %   Neutro Abs 7.4 1.7 - 7.7 K/uL   Lymphocytes Relative 19 %   Lymphs Abs 1.9 0.7 - 4.0 K/uL   Monocytes Relative 7 %   Monocytes Absolute 0.7 0.1 - 1.0 K/uL   Eosinophils Relative 0 %   Eosinophils Absolute 0.0 0.0 - 0.7 K/uL   Basophils Relative 0 %   Basophils Absolute 0.0 0.0 - 0.1 K/uL  Basic metabolic panel     Status: Abnormal   Collection Time: 08/17/16  1:18 AM  Result Value Ref Range   Sodium 137 135 - 145 mmol/L   Potassium 3.7 3.5 - 5.1 mmol/L   Chloride 105 101 - 111 mmol/L   CO2 22 22 - 32 mmol/L   Glucose, Bld 103 (H) 65 - 99 mg/dL   BUN 11 6 - 20 mg/dL   Creatinine, Ser 7.820.73 0.44 - 1.00 mg/dL    Calcium 9.2 8.9 - 95.610.3 mg/dL   GFR calc non Af Amer >60 >60 mL/min   GFR calc Af Amer >60 >60 mL/min   Anion gap 10 5 - 15   Imaging Studies: Ct Angio Chest Pe W/cm &/or Wo Cm  Result Date: 08/17/2016 CLINICAL DATA:  Cough for 2 days, hemoptysis tonight. Throat pain with inspiration. History of hypertension and bronchitis. EXAM: CT ANGIOGRAPHY CHEST WITH CONTRAST TECHNIQUE: Multidetector CT imaging of the chest was performed using the standard protocol during bolus administration of intravenous contrast. Multiplanar CT image reconstructions and MIPs were obtained to evaluate the vascular anatomy. CONTRAST:  100 cc Isovue 370 COMPARISON:  None. FINDINGS: CARDIOVASCULAR: Adequate contrast opacification of the pulmonary artery's. Main pulmonary  artery is not enlarged. No pulmonary arterial filling defects to the level of the subsegmental branches. Heart size is normal, no right heart strain. No pericardial effusion. Thoracic aorta is normal course and caliber, unremarkable. MEDIASTINUM/NODES: Borderline RIGHT hilar and subcarinal lymphadenopathy. LUNGS/PLEURA: Tracheobronchial tree is patent, no pneumothorax. Mild bronchial wall thickening. No pleural effusions, focal consolidations, pulmonary nodules or masses. Subcentimeter RIGHT apical air cyst and scarring. UPPER ABDOMEN: Included view of the abdomen is unremarkable. MUSCULOSKELETAL: Visualized soft tissues and included osseous structures are nonacute. Multilevel Schmorl's nodes. Review of the MIP images confirms the above findings. IMPRESSION: No acute pulmonary embolism. Bronchial wall thickening seen with bronchitis or reactive airway disease without pneumonia. Borderline reactive lymphadenopathy. Electronically Signed   By: Awilda Metro M.D.   On: 08/17/2016 04:17    ED COURSE  Nursing notes and initial vitals signs, including pulse oximetry, reviewed.  Vitals:   08/17/16 0155 08/17/16 0230 08/17/16 0300 08/17/16 0300  BP: 124/65 122/67 (!)  106/54 106/74  Pulse: 76 88 78 78  Resp: (!) 21   19  Temp:      TempSrc:      SpO2: 99% 95% 97% 98%  Weight:      Height:        PROCEDURES    ED DIAGNOSES     ICD-10-CM   1. Acute bronchitis due to infection J20.8   2. Chest wall pain R07.89        Paula Libra, MD 08/17/16 [phone]

## 2016-08-17 NOTE — ED Triage Notes (Signed)
Pt reports having coughing last two days and today began coughing up blood tonight. Pt reports to being a smoker and hx of bronchitis. Pt reports to sore throat and pain with inspiration.

## 2016-08-20 ENCOUNTER — Emergency Department
Admission: EM | Admit: 2016-08-20 | Discharge: 2016-08-20 | Disposition: A | Discharge status: Home / Self Care | Payer: Self-pay | Attending: Emergency Medicine | Admitting: Emergency Medicine

## 2016-08-20 ENCOUNTER — Encounter: Payer: Self-pay | Admitting: Emergency Medicine

## 2016-08-20 ENCOUNTER — Emergency Department: Payer: Self-pay

## 2016-08-20 DIAGNOSIS — J40 Bronchitis, not specified as acute or chronic: Secondary | ICD-10-CM | POA: Insufficient documentation

## 2016-08-20 DIAGNOSIS — F17200 Nicotine dependence, unspecified, uncomplicated: Secondary | ICD-10-CM | POA: Insufficient documentation

## 2016-08-20 DIAGNOSIS — Z79899 Other long term (current) drug therapy: Secondary | ICD-10-CM | POA: Insufficient documentation

## 2016-08-20 DIAGNOSIS — I10 Essential (primary) hypertension: Secondary | ICD-10-CM | POA: Insufficient documentation

## 2016-08-20 MED ORDER — ALBUTEROL SULFATE HFA 108 (90 BASE) MCG/ACT IN AERS: 2.0000 | INHALATION_SPRAY | Freq: Four times a day (QID) | RESPIRATORY_TRACT | 0 refills | Status: AC | PRN

## 2016-08-20 MED ORDER — ALBUTEROL SULFATE (2.5 MG/3ML) 0.083% IN NEBU
5.0000 mg | INHALATION_SOLUTION | Freq: Once | RESPIRATORY_TRACT | Status: AC
  Administered 2016-08-20: 5 mg via RESPIRATORY_TRACT

## 2016-08-20 MED ORDER — LEVOFLOXACIN 500 MG PO TABS: 500.0000 mg | ORAL_TABLET | Freq: Every day | ORAL | 0 refills | Status: AC

## 2016-08-20 MED ORDER — IPRATROPIUM-ALBUTEROL 0.5-2.5 (3) MG/3ML IN SOLN
3.0000 mL | Freq: Once | RESPIRATORY_TRACT | Status: AC
  Administered 2016-08-20: 3 mL via RESPIRATORY_TRACT
  Filled 2016-08-20: qty 3

## 2016-08-20 MED ORDER — ALBUTEROL SULFATE (2.5 MG/3ML) 0.083% IN NEBU
INHALATION_SOLUTION | RESPIRATORY_TRACT | Status: AC
  Administered 2016-08-20: 5 mg via RESPIRATORY_TRACT
  Filled 2016-08-20: qty 6

## 2016-08-20 MED ORDER — PREDNISONE 10 MG (21) PO TBPK
ORAL_TABLET | ORAL | 0 refills | Status: DC
Start: 2016-08-20 — End: 2020-08-14

## 2016-08-20 MED ORDER — METHYLPREDNISOLONE SODIUM SUCC 125 MG IJ SOLR
125.0000 mg | Freq: Once | INTRAMUSCULAR | Status: AC
  Administered 2016-08-20: 125 mg via INTRAMUSCULAR
  Filled 2016-08-20: qty 2

## 2016-08-20 NOTE — ED Notes (Signed)
Patient brought back to room 41. Patient unable to speak full complete sentences. Expiratory wheezes noted throughout lung fields.

## 2016-08-20 NOTE — ED Triage Notes (Signed)
Pt reports cough for three weeks. Pt states has taken Z-pak and not getting better. Pt reports not coughing anything up but feels like phlegm is stuck in chest. Pt speaking in complete sentences without difficulty. Color WNL. Pt reports nasal congestion as well.

## 2016-08-20 NOTE — ED Provider Notes (Signed)
[facility] Emergency Department Provider Note  ____________________________________________  Time seen: Approximately 4:01 PM  I have reviewed the triage vital signs and the nursing notes.   HISTORY  Chief Complaint Cough    HPI Tara Horton is a 26 y.o. female that presents to the emergency department with non productive cough and wheezing for 5 days. She is not having any difficulty breathing but can hear herself wheezing. She noticed some congestion towards the end of this week. Patient states that she went to [facility] on Monday and was given azithromycin. She had an episode of hemoptysis on Sunday evening but has not had any additional episodes. Imaging on Monday indicated bronchitis. Patient smokes a pack of cigarettes per day. No asthma or allergies. She denies fevers, shortness of breath, chest pain, nausea, vomiting, abdominal pain.   Past Medical History:  Diagnosis Date  . Bronchitis   . Hypertension     Patient Active Problem List   Diagnosis Date Noted  . Salpingitis/oophoritis, acute 06/28/2016    Past Surgical History:  Procedure Laterality Date  . DIAGNOSTIC LAPAROSCOPY WITH REMOVAL OF ECTOPIC PREGNANCY N/A 06/28/2016   Procedure: DIAGNOSTIC LAPAROSCOPY;  Surgeon: Christeen DouglasBethany Beasley, MD;  Location: [facility] ORS;  Service: Gynecology;  Laterality: N/A;  . DILATION AND CURETTAGE OF UTERUS N/A 07/02/2016   Procedure: DILATATION AND CURETTAGE;  Surgeon: Christeen DouglasBethany Beasley, MD;  Location: [facility] ORS;  Service: Gynecology;  Laterality: N/A;  . LAPAROSCOPY Right 07/02/2016   Procedure: LAPAROSCOPY DIAGNOSTIC;  Surgeon: Christeen DouglasBethany Beasley, MD;  Location: [facility] ORS;  Service: Gynecology;  Laterality: Right;  . TONSILLECTOMY      Prior to Admission medications   Medication Sig Start Date End Date Taking? Authorizing Provider  albuterol (PROVENTIL HFA;VENTOLIN HFA) 108 (90 Base) MCG/ACT inhaler Inhale 2 puffs into the lungs every  6 (six) hours as needed for wheezing or shortness of breath. 08/20/16   Enid DerryWagner, Immaculate Crutcher, PA-C  carvedilol (COREG) 25 MG tablet Take 25 mg by mouth 2 (two) times daily with a meal.    [provider]  chlorpheniramine-HYDROcodone (TUSSIONEX PENNKINETIC ER) 10-8 MG/5ML SUER Take 5 mLs by mouth every 12 (twelve) hours as needed for cough. 08/17/16   Molpus, John, MD  levofloxacin (LEVAQUIN) 500 MG tablet Take 1 tablet (500 mg total) by mouth daily. 08/20/16 08/30/16  Enid DerryWagner, Prashant Glosser, PA-C  predniSONE (STERAPRED UNI-PAK 21 TAB) 10 MG (21) TBPK tablet Take 6 tablets on day 1, take 5 tablets on day 2, take 4 tablets on day 3, take 3 tablets on day 4, take 2 tablets on day 5, take 1 tablet on day 6 08/20/16   Enid DerryWagner, Shakeya Kerkman, PA-C    Allergies Novocain [procaine] and Ibuprofen  No family history on file.  Social History Social History  Substance Use Topics  . Smoking status: Current Every Day Smoker    Packs/day: 1.00  . Smokeless tobacco: Never Used  . Alcohol use No     Review of Systems  Constitutional: No fever/chills Eyes: No visual changes. No discharge. Cardiovascular: No chest pain. Respiratory: Positive for cough. No SOB. Gastrointestinal: No abdominal pain.  No nausea, no vomiting.   Musculoskeletal: Negative for musculoskeletal pain. Skin: Negative for rash, abrasions, lacerations, ecchymosis. Neurological: Negative for headaches.   ____________________________________________   PHYSICAL EXAM:  VITAL SIGNS: ED Triage Vitals  Enc Vitals Group     BP 08/20/16 1442 (!) 135/92     Pulse Rate 08/20/16 1442 (!) 105     Resp  08/20/16 1442 20     Temp 08/20/16 1442 98.3 F (36.8 C)     Temp Source 08/20/16 1442 Oral     SpO2 08/20/16 1442 97 %     Weight 08/20/16 1442 217 lb (98.4 kg)     Height 08/20/16 1442 5\' 6"  (1.676 m)     Head Circumference --      Peak Flow --      Pain Score 08/20/16 1441 8     Pain Loc --      Pain Edu? --      Excl. in GC? --       Constitutional: Alert and oriented. Well appearing and in no acute distress. Eyes: Conjunctivae are normal. PERRL. EOMI. No discharge. Head: Atraumatic. ENT: No frontal and maxillary sinus tenderness.      Ears: Tympanic membranes pearly gray with good landmarks. No discharge.      Nose: No congestion/rhinnorhea.      Mouth/Throat: Mucous membranes are moist. Oropharynx non-erythematous.  Neck: No stridor.   Hematological/Lymphatic/Immunilogical: No cervical lymphadenopathy. Cardiovascular: Normal rate, regular rhythm.  Good peripheral circulation. Respiratory: Normal respiratory effort without tachypnea or retractions. Lungs CTAB. Good air entry to the bases with no decreased or absent breath sounds. Gastrointestinal: Bowel sounds 4 quadrants. Soft and nontender to palpation. No guarding or rigidity. No palpable masses. No distention. Musculoskeletal: Full range of motion to all extremities. No gross deformities appreciated. Neurologic:  Normal speech and language. No gross focal neurologic deficits are appreciated.  Skin:  Skin is warm, dry and intact. No rash noted.   ____________________________________________   LABS (all labs ordered are listed, but only abnormal results are displayed)  Labs Reviewed - No data to display ____________________________________________  EKG   ____________________________________________  RADIOLOGY Lexine Baton, personally viewed and evaluated these images (plain radiographs) as part of my medical decision making, as well as reviewing the written report by the radiologist.  Dg Chest 2 View  Result Date: 08/20/2016 CLINICAL DATA:  Cough and wheezing EXAM: CHEST  2 VIEW COMPARISON:  Chest CT August 17, 2016 FINDINGS: Lungs are clear. Heart size and pulmonary vascularity are normal. No adenopathy. No bone lesions. IMPRESSION: No edema or consolidation. Electronically Signed   By: Bretta Bang III M.D.   On: 08/20/2016 15:49     ____________________________________________    PROCEDURES  Procedure(s) performed:    Procedures    Medications  albuterol (PROVENTIL) (2.5 MG/3ML) 0.083% nebulizer solution 5 mg (5 mg Nebulization Given 08/20/16 1452)  ipratropium-albuterol (DUONEB) 0.5-2.5 (3) MG/3ML nebulizer solution 3 mL (3 mLs Nebulization Given 08/20/16 1703)  methylPREDNISolone sodium succinate (SOLU-MEDROL) 125 mg/2 mL injection 125 mg (125 mg Intramuscular Given 08/20/16 1704)     ____________________________________________   INITIAL IMPRESSION / ASSESSMENT AND PLAN / ED COURSE  Pertinent labs & imaging results that were available during my care of the patient were reviewed by me and considered in my medical decision making (see chart for details).  Review of the [place] CSRS was performed in accordance of the [facility] prior to dispensing any controlled drugs.   Patient's diagnosis is consistent with bronchitis. Vital signs and exam are reassuring. No acute cardiopulmonary processes indicated on x-ray. Patient did not feel improvement with breathing after first DuoNeb so second DuoNeb was given. Dose of Solu-Medrol was given. Patient finished a course of azithromycin on Wednesday. I discussed with patient that I think her bronchitis is likely viral, which is why antibiotics did not help. We discussed that I  will write a prescription for another antibiotic but patient will hold off on taking them unless symptoms worsen or continue into next week. Patient is agreeable with this plan. Patient feels comfortable going home. Patient will be discharged home with prescriptions for prednisone, Levaquin, albuterol inhaler. Patient is to follow up with PCP as needed or otherwise directed. Patient is given ED precautions to return to the ED for any worsening or new symptoms.     ____________________________________________  FINAL CLINICAL IMPRESSION(S) / ED DIAGNOSES  Final diagnoses:  Bronchitis      NEW  MEDICATIONS STARTED DURING THIS VISIT:  Discharge Medication List as of 08/20/2016  5:12 PM    START taking these medications   Details  albuterol (PROVENTIL HFA;VENTOLIN HFA) 108 (90 Base) MCG/ACT inhaler Inhale 2 puffs into the lungs every 6 (six) hours as needed for wheezing or shortness of breath., Starting Fri 08/20/2016, Print    levofloxacin (LEVAQUIN) 500 MG tablet Take 1 tablet (500 mg total) by mouth daily., Starting Fri 08/20/2016, Until Mon 08/30/2016, Print    predniSONE (STERAPRED UNI-PAK 21 TAB) 10 MG (21) TBPK tablet Take 6 tablets on day 1, take 5 tablets on day 2, take 4 tablets on day 3, take 3 tablets on day 4, take 2 tablets on day 5, take 1 tablet on day 6, Print            This chart was dictated using voice recognition software/Dragon. Despite best efforts to proofread, errors can occur which can change the meaning. Any change was purely unintentional.    Enid Derry, PA-C 08/20/16 2120    Nita Sickle, MD 08/20/16 [phone]

## 2016-09-07 ENCOUNTER — Emergency Department: Payer: Medicaid - Out of State

## 2016-09-07 ENCOUNTER — Emergency Department
Admission: EM | Admit: 2016-09-07 | Discharge: 2016-09-07 | Disposition: A | Discharge status: Home / Self Care | Payer: Medicaid - Out of State | Attending: Emergency Medicine | Admitting: Emergency Medicine

## 2016-09-07 ENCOUNTER — Encounter: Payer: Self-pay | Admitting: Emergency Medicine

## 2016-09-07 DIAGNOSIS — Y9301 Activity, walking, marching and hiking: Secondary | ICD-10-CM | POA: Insufficient documentation

## 2016-09-07 DIAGNOSIS — I10 Essential (primary) hypertension: Secondary | ICD-10-CM | POA: Insufficient documentation

## 2016-09-07 DIAGNOSIS — Y92481 Parking lot as the place of occurrence of the external cause: Secondary | ICD-10-CM | POA: Insufficient documentation

## 2016-09-07 DIAGNOSIS — Y999 Unspecified external cause status: Secondary | ICD-10-CM | POA: Insufficient documentation

## 2016-09-07 DIAGNOSIS — W010XXA Fall on same level from slipping, tripping and stumbling without subsequent striking against object, initial encounter: Secondary | ICD-10-CM | POA: Insufficient documentation

## 2016-09-07 DIAGNOSIS — Z79899 Other long term (current) drug therapy: Secondary | ICD-10-CM | POA: Insufficient documentation

## 2016-09-07 DIAGNOSIS — F17200 Nicotine dependence, unspecified, uncomplicated: Secondary | ICD-10-CM | POA: Insufficient documentation

## 2016-09-07 DIAGNOSIS — S93402A Sprain of unspecified ligament of left ankle, initial encounter: Secondary | ICD-10-CM | POA: Insufficient documentation

## 2016-09-07 MED ORDER — ACETAMINOPHEN 325 MG PO TABS
650.0000 mg | ORAL_TABLET | Freq: Once | ORAL | Status: AC
  Administered 2016-09-07: 650 mg via ORAL
  Filled 2016-09-07: qty 2
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
## 2016-09-10 ENCOUNTER — Encounter: Payer: Self-pay | Admitting: Medical Oncology

## 2016-09-10 ENCOUNTER — Emergency Department: Payer: Self-pay

## 2016-09-10 ENCOUNTER — Emergency Department
Admission: EM | Admit: 2016-09-10 | Discharge: 2016-09-10 | Disposition: A | Discharge status: Home / Self Care | Payer: Self-pay | Attending: Emergency Medicine | Admitting: Emergency Medicine

## 2016-09-10 DIAGNOSIS — F17210 Nicotine dependence, cigarettes, uncomplicated: Secondary | ICD-10-CM | POA: Insufficient documentation

## 2016-09-10 DIAGNOSIS — M25512 Pain in left shoulder: Secondary | ICD-10-CM | POA: Insufficient documentation

## 2016-09-10 DIAGNOSIS — Z79899 Other long term (current) drug therapy: Secondary | ICD-10-CM | POA: Insufficient documentation

## 2016-09-10 DIAGNOSIS — I10 Essential (primary) hypertension: Secondary | ICD-10-CM | POA: Insufficient documentation

## 2016-09-10 MED ORDER — CYCLOBENZAPRINE HCL 5 MG PO TABS: 5.0000 mg | ORAL_TABLET | Freq: Three times a day (TID) | ORAL | 0 refills | Status: AC | PRN
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
## 2016-09-10 NOTE — ED Notes (Signed)
Patient transported to X-ray 

## 2016-10-13 ENCOUNTER — Emergency Department: Payer: Self-pay

## 2016-10-13 ENCOUNTER — Emergency Department
Admission: EM | Admit: 2016-10-13 | Discharge: 2016-10-13 | Disposition: A | Discharge status: Home / Self Care | Payer: Self-pay | Attending: Student in an Organized Health Care Education/Training Program | Admitting: Student in an Organized Health Care Education/Training Program

## 2016-10-13 DIAGNOSIS — Z79899 Other long term (current) drug therapy: Secondary | ICD-10-CM | POA: Insufficient documentation

## 2016-10-13 DIAGNOSIS — I10 Essential (primary) hypertension: Secondary | ICD-10-CM | POA: Insufficient documentation

## 2016-10-13 DIAGNOSIS — F17210 Nicotine dependence, cigarettes, uncomplicated: Secondary | ICD-10-CM | POA: Insufficient documentation

## 2016-10-13 DIAGNOSIS — R1032 Left lower quadrant pain: Secondary | ICD-10-CM | POA: Insufficient documentation

## 2016-10-13 LAB — COMPREHENSIVE METABOLIC PANEL
ALBUMIN: 4.3 g/dL (ref 3.5–5.0)
ALK PHOS: 68 U/L (ref 38–126)
ALT: 16 U/L (ref 14–54)
ANION GAP: 9 (ref 5–15)
AST: 19 U/L (ref 15–41)
BUN: 11 mg/dL (ref 6–20)
CALCIUM: 9.2 mg/dL (ref 8.9–10.3)
CHLORIDE: 108 mmol/L (ref 101–111)
CO2: 24 mmol/L (ref 22–32)
Creatinine, Ser: 0.68 mg/dL (ref 0.44–1.00)
GFR calc Af Amer: >60 mL/min (ref >60)
GFR calc non Af Amer: >60 mL/min (ref >60)
GLUCOSE: 105 mg/dL — AB (ref 65–99)
POTASSIUM: 3.7 mmol/L (ref 3.5–5.1)
SODIUM: 141 mmol/L (ref 135–145)
Total Bilirubin: 0.7 mg/dL (ref 0.3–1.2)
Total Protein: 7.4 g/dL (ref 6.5–8.1)

## 2016-10-13 LAB — URINALYSIS, COMPLETE (UACMP) WITH MICROSCOPIC
BACTERIA UA: NONE SEEN
BILIRUBIN URINE: NEGATIVE
Glucose, UA: NEGATIVE mg/dL
KETONES UR: NEGATIVE mg/dL
LEUKOCYTES UA: NEGATIVE
NITRITE: NEGATIVE
Protein, ur: NEGATIVE mg/dL
Specific Gravity, Urine: 1.016 (ref 1.005–1.030)
pH: 7 (ref 5.0–8.0)

## 2016-10-13 LAB — CBC WITH DIFFERENTIAL/PLATELET
BASOS PCT: 0 %
Basophils Absolute: 0 10*3/uL (ref 0–0.1)
EOS ABS: 0.1 10*3/uL (ref 0–0.7)
EOS PCT: 1 %
HCT: 40.3 % (ref 35.0–47.0)
HEMOGLOBIN: 14.1 g/dL (ref 12.0–16.0)
Lymphocytes Relative: 29 %
Lymphs Abs: 2.8 10*3/uL (ref 1.0–3.6)
MCH: 32.5 pg (ref 26.0–34.0)
MCHC: 34.9 g/dL (ref 32.0–36.0)
MCV: 93.1 fL (ref 80.0–100.0)
Monocytes Absolute: 0.8 10*3/uL (ref 0.2–0.9)
Monocytes Relative: 8 %
NEUTROS PCT: 62 %
Neutro Abs: 6.1 10*3/uL (ref 1.4–6.5)
PLATELETS: 142 10*3/uL — AB (ref 150–440)
RBC: 4.33 MIL/uL (ref 3.80–5.20)
RDW: 13.3 % (ref 11.5–14.5)
WBC: 9.8 10*3/uL (ref 3.6–11.0)

## 2016-10-13 LAB — POCT PREGNANCY, URINE: PREG TEST UR: NEGATIVE

## 2016-10-13 LAB — LIPASE, BLOOD: LIPASE: 41 U/L (ref 11–51)

## 2016-10-13 MED ORDER — MORPHINE SULFATE (PF) 4 MG/ML IV SOLN
4.0000 mg | Freq: Once | INTRAVENOUS | Status: AC
  Administered 2016-10-13: 4 mg via INTRAVENOUS

## 2016-10-13 MED ORDER — HALOPERIDOL LACTATE 5 MG/ML IJ SOLN
5.0000 mg | Freq: Once | INTRAMUSCULAR | Status: AC
  Administered 2016-10-13: 5 mg via INTRAVENOUS

## 2016-10-13 MED ORDER — MORPHINE SULFATE (PF) 4 MG/ML IV SOLN
INTRAVENOUS | Status: AC
  Filled 2016-10-13: qty 1

## 2016-10-13 MED ORDER — ONDANSETRON HCL 4 MG/2ML IJ SOLN
INTRAMUSCULAR | Status: AC
  Filled 2016-10-13: qty 2

## 2016-10-13 MED ORDER — ONDANSETRON HCL 4 MG/2ML IJ SOLN
4.0000 mg | Freq: Once | INTRAMUSCULAR | Status: AC
  Administered 2016-10-13: 4 mg via INTRAVENOUS

## 2016-10-13 MED ORDER — HALOPERIDOL LACTATE 5 MG/ML IJ SOLN
INTRAMUSCULAR | Status: AC
  Filled 2016-10-13: qty 1

## 2016-10-13 NOTE — ED Triage Notes (Addendum)
Pt in with co severe left lower quad pain, states had same symptoms to right abd in April and had to have right fellopian tube removed due to "rupture".

## 2016-10-13 NOTE — Discharge Instructions (Signed)

## 2016-10-13 NOTE — ED Provider Notes (Signed)
[facility] Emergency Department Provider Note    None    (approximate)  I have reviewed the triage vital signs and the nursing notes.   HISTORY   Chief Complaint Abdominal Pain    HPI Tara Horton is a 26 y.o. 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 Denies any fevers. States the pain is 10 out of 10 in severity. Has noted some hematuria. Has noted some dysuria. No vaginal discharge.Not currently on her cycle. States she has chronic diarrhea but no blood in her stools.   Past Medical History:  Diagnosis Date  . Bronchitis   . Hypertension    No family history on file. Past Surgical History:  Procedure Laterality Date  . DIAGNOSTIC LAPAROSCOPY WITH REMOVAL OF ECTOPIC PREGNANCY N/A 06/28/2016   Procedure: DIAGNOSTIC LAPAROSCOPY;  Surgeon: Christeen Douglas, MD;  Location: [facility] ORS;  Service: Gynecology;  Laterality: N/A;  . DILATION AND CURETTAGE OF UTERUS N/A 07/02/2016   Procedure: DILATATION AND CURETTAGE;  Surgeon: Christeen Douglas, MD;  Location: [facility];  Service: Gynecology;  Laterality: N/A;  . LAPAROSCOPY Right 07/02/2016   Procedure: LAPAROSCOPY DIAGNOSTIC;  Surgeon: Christeen Douglas, MD;  Location: [facility] OR[facility];  Service: Gynecology;  Laterality: Right;  . TONSILLECTOMY     Patient Active Problem List   Diagnosis Date Noted  . Salpingitis/oophoritis, acute 06/28/2016      Prior to Admission medications   Medication Sig Start Date End Date Taking? Authorizing Provider  albuterol (PROVENTIL HFA;VENTOLIN HFA) 108 (90 Base) MCG/ACT inhaler Inhale 2 puffs into the lungs every 6 (six) hours as needed for wheezing or shortness of breath. 08/20/16   Enid Derry, PA-C  carvedilol (COREG) 25 MG tablet Take 25 mg by mouth 2 (two) times  daily with a meal.    [provider]  chlorpheniramine-HYDROcodone (TUSSIONEX PENNKINETIC ER) 10-8 MG/5ML SUER Take 5 mLs by mouth every 12 (twelve) hours as needed for cough. 08/17/16   Molpus, Jonny Ruiz, MD  predniSONE (STERAPRED UNI-PAK 21 TAB) 10 MG (21) TBPK tablet Take 6 tablets on day 1, take 5 tablets on day 2, take 4 tablets on day 3, take 3 tablets on day 4, take 2 tablets on day 5, take 1 tablet on day 6 08/20/16   Enid Derry, PA-C    Allergies Novocain [procaine] and Ibuprofen    Social History Social History  Substance Use Topics  . Smoking status: Current Every Day Smoker    Packs/day: 1.00  . Smokeless tobacco: Never Used  . Alcohol use No    Review of Systems Patient denies headaches, rhinorrhea, blurry vision, numbness, shortness of breath, chest pain, edema, cough, abdominal pain, nausea, vomiting, diarrhea, dysuria, fevers, rashes or hallucinations unless otherwise stated above in HPI. ____________________________________________   PHYSICAL EXAM:  VITAL SIGNS: Vitals:   10/13/16 0045  BP: (!) 145/83  Pulse: (!) 103  Resp: 20  Temp: 97.7 F (36.5 C)    Constitutional: Alert and oriented.uncomfortable appearing but and in no acute distress. Eyes: Conjunctivae are normal.  Head: Atraumatic. Nose: No congestion/rhinnorhea. Mouth/Throat: Mucous membranes are moist.   Neck: No stridor. Painless ROM.  Cardiovascular: Normal rate, regular rhythm. Grossly normal heart sounds.  Good peripheral circulation. Respiratory: Normal respiratory effort.  No retractions. Lungs CTAB. Gastrointestinal: Soft  With ttp  llq. No distention. No abdominal bruits. No CVA tenderness. Musculoskeletal: No lower extremity tenderness nor edema.  No joint effusions. Neurologic:  Normal speech and language. No gross focal neurologic deficits are appreciated. No facial droop Skin:  Skin is warm, dry and intact. No rash noted. Psychiatric: anxious and  tearful.  ____________________________________________   LABS (all labs ordered are listed, but only abnormal results are displayed)  Results for orders placed or performed during the hospital encounter of 10/13/16 (from the past 24 hour(s))  Lipase, blood     Status: None   Collection Time: 10/13/16 12:55 AM  Result Value Ref Range   Lipase 41 11 - 51 U/L  Comprehensive metabolic panel     Status: Abnormal   Collection Time: 10/13/16 12:55 AM  Result Value Ref Range   Sodium 141 135 - 145 mmol/L   Potassium 3.7 3.5 - 5.1 mmol/L   Chloride 108 101 - 111 mmol/L   CO2 24 22 - 32 mmol/L   Glucose, Bld 105 (H) 65 - 99 mg/dL   BUN 11 6 - 20 mg/dL   Creatinine, Ser 1.61 0.44 - 1.00 mg/dL   Calcium 9.2 8.9 - 09.6 mg/dL   Total Protein 7.4 6.5 - 8.1 g/dL   Albumin 4.3 3.5 - 5.0 g/dL   AST 19 15 - 41 U/L   ALT 16 14 - 54 U/L   Alkaline Phosphatase 68 38 - 126 U/L   Total Bilirubin 0.7 0.3 - 1.2 mg/dL   GFR calc non Af Amer >60 >60 mL/min   GFR calc Af Amer >60 >60 mL/min   Anion gap 9 5 - 15  CBC with Differential/Platelet     Status: Abnormal   Collection Time: 10/13/16 12:55 AM  Result Value Ref Range   WBC 9.8 3.6 - 11.0 K/uL   RBC 4.33 3.80 - 5.20 MIL/uL   Hemoglobin 14.1 12.0 - 16.0 g/dL   HCT 04.5 40.9 - 81.1 %   MCV 93.1 80.0 - 100.0 fL   MCH 32.5 26.0 - 34.0 pg   MCHC 34.9 32.0 - 36.0 g/dL   RDW 91.4 78.2 - 95.6 %   Platelets 142 (L) 150 - 440 K/uL   Neutrophils Relative % 62 %   Neutro Abs 6.1 1.4 - 6.5 K/uL   Lymphocytes Relative 29 %   Lymphs Abs 2.8 1.0 - 3.6 K/uL   Monocytes Relative 8 %   Monocytes Absolute 0.8 0.2 - 0.9 K/uL   Eosinophils Relative 1 %   Eosinophils Absolute 0.1 0 - 0.7 K/uL   Basophils Relative 0 %   Basophils Absolute 0.0 0 - 0.1 K/uL  Urinalysis, Complete w Microscopic     Status: Abnormal   Collection Time: 10/13/16 12:56 AM  Result Value Ref Range   Color, Urine YELLOW (A) YELLOW   APPearance HAZY (A) CLEAR   Specific Gravity,  Urine 1.016 1.005 - 1.030   pH 7.0 5.0 - 8.0   Glucose, UA NEGATIVE NEGATIVE mg/dL   Hgb urine dipstick LARGE (A) NEGATIVE   Bilirubin Urine NEGATIVE NEGATIVE   Ketones, ur NEGATIVE NEGATIVE mg/dL   Protein, ur NEGATIVE NEGATIVE mg/dL   Nitrite NEGATIVE NEGATIVE   Leukocytes, UA NEGATIVE NEGATIVE   RBC / HPF TOO NUMEROUS TO COUNT 0 - 5 RBC/hpf   WBC, UA 6-30 0 - 5 WBC/hpf   Bacteria, UA NONE SEEN NONE SEEN   Squamous Epithelial / LPF 0-5 (A) NONE SEEN   Mucous PRESENT   Pregnancy, urine  POC     Status: None   Collection Time: 10/13/16  1:04 AM  Result Value Ref Range   Preg Test, Ur NEGATIVE NEGATIVE   ____________________________________________   ____________________________________________  RADIOLOGY  I personally reviewed all radiographic images ordered to evaluate for the above acute complaints and reviewed radiology reports and findings.  These findings were personally discussed with the patient.  Please see medical record for radiology report.  ____________________________________________   PROCEDURES  Procedure(s) performed:  Procedures    Critical Care performed: no ____________________________________________   INITIAL IMPRESSION / ASSESSMENT AND PLAN / ED COURSE  Pertinent labs & imaging results that were available during my care of the patient were reviewed by me and considered in my medical decision making (see chart for details).  DDX: stone, pid, toa, uti, diverticulitis, constipation, torsion  Armstead PeaksLynda Brooke Ohman is a 26 y.o. who presents to the ED with Acute onset left lower quadrant pain as described above. Patient very uncomfortable therefore given IV pain medication. Blood work reassuring. No evidence of leukocytosis or acidosis. No evidence of pancreatitis. Patient is not pregnant. Urinalysis does show blood therefore CT renal stone study ordered to evaluate for kidney stone. There are left-sided stones but non-obstructing at this time. Given her  complex gynecological history ultrasound of the pelvis was ordered to rule out cyst, torsion, pyosalpinx. Pelvic ultrasound is reassuring. Repeat abdominal exam is soft and benign. Patient had significant improvement in symptoms after IV pain medications. She tolerating oral hydration. No tachycardia. Patient stable and appropriate for follow-up with her PCP.      ____________________________________________   FINAL CLINICAL IMPRESSION(S) / ED DIAGNOSES  Final diagnoses:  LLQ abdominal pain      NEW MEDICATIONS STARTED DURING THIS VISIT:  New Prescriptions   No medications on file     Note:  This document was prepared using Dragon voice recognition software and may include unintentional dictation errors.    Willy Eddyobinson, Justise Ehmann, MD 10/13/16 [phone]

## 2019-05-06 IMAGING — DX DG ANKLE COMPLETE 3+V*L*
3 series · 3 of 3 positions shown · non-contrast
Comparison: None

CLINICAL DATA: Tripped over a curb this morning, redness and
abrasion at medial malleolus

EXAM:
LEFT ANKLE COMPLETE - 3+ VIEW

[ankle ap]
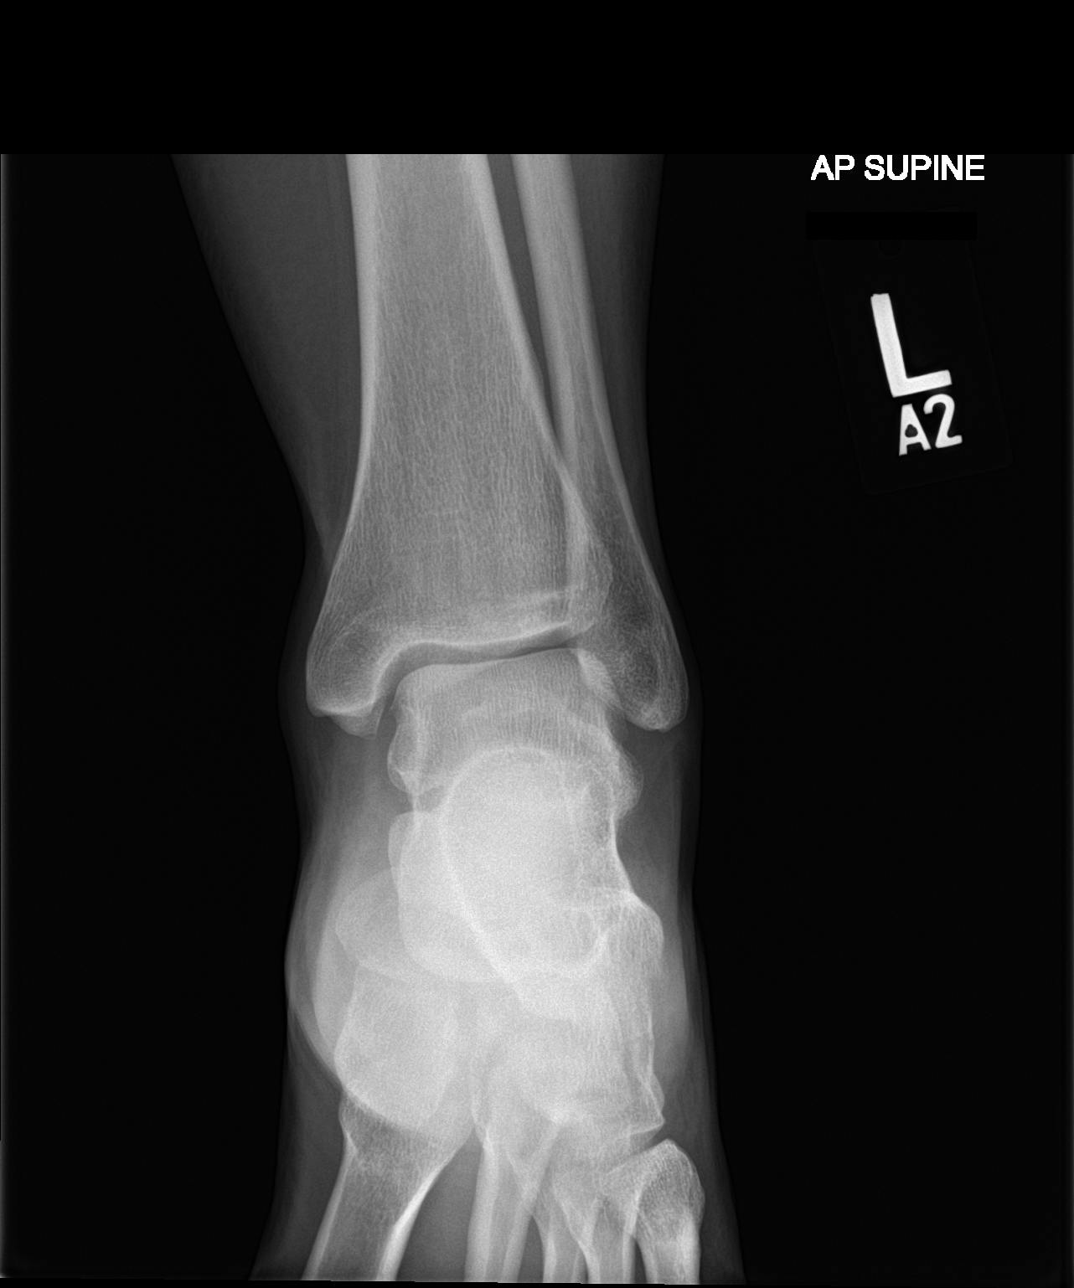

[ankle obl]
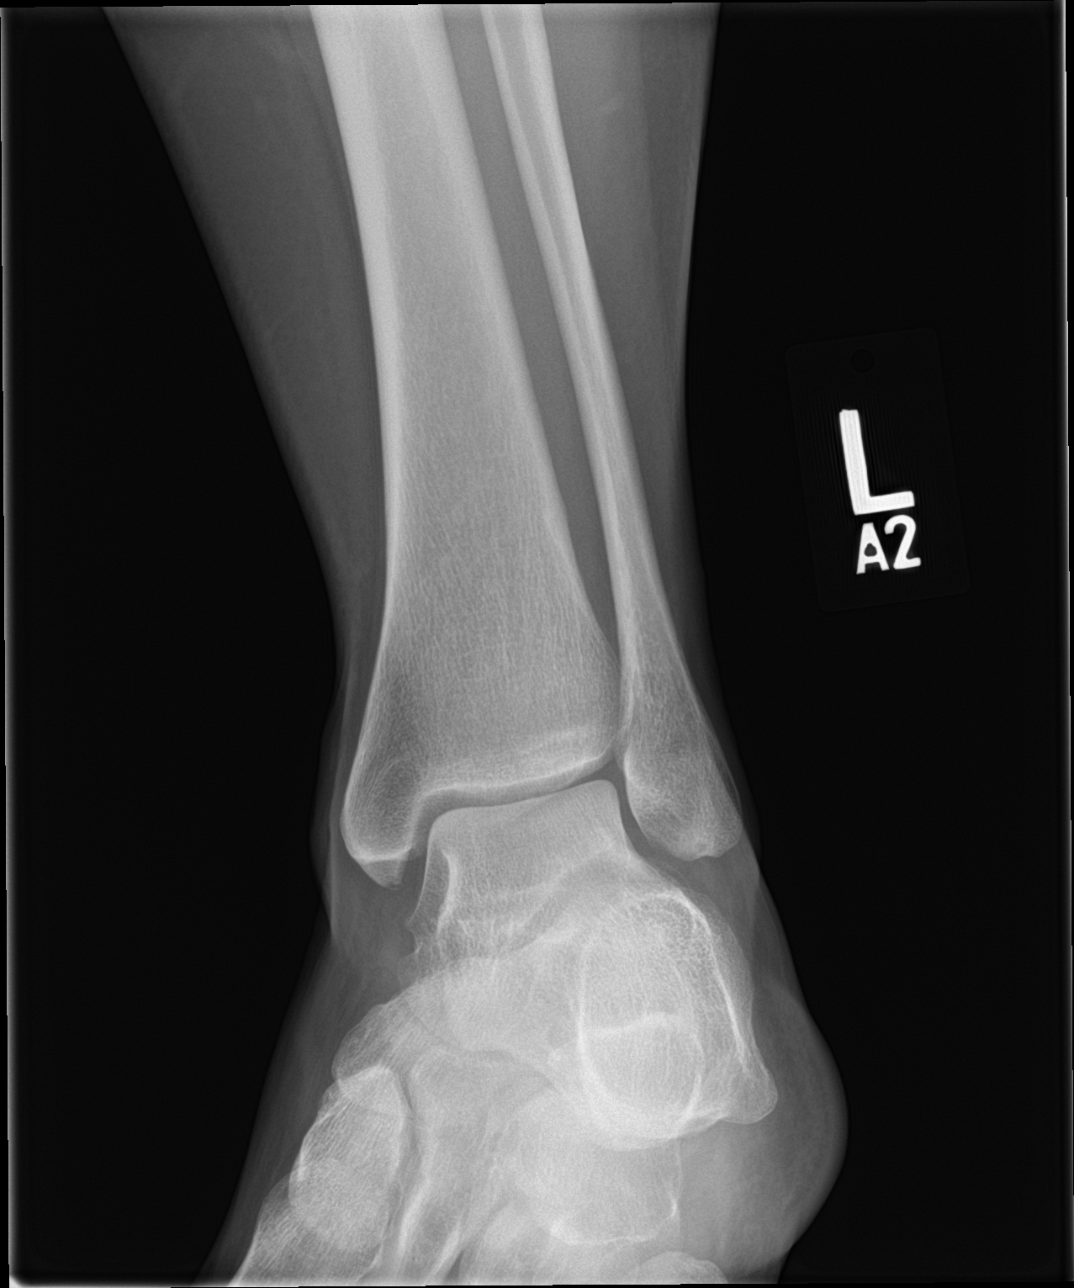

[ankle lat]
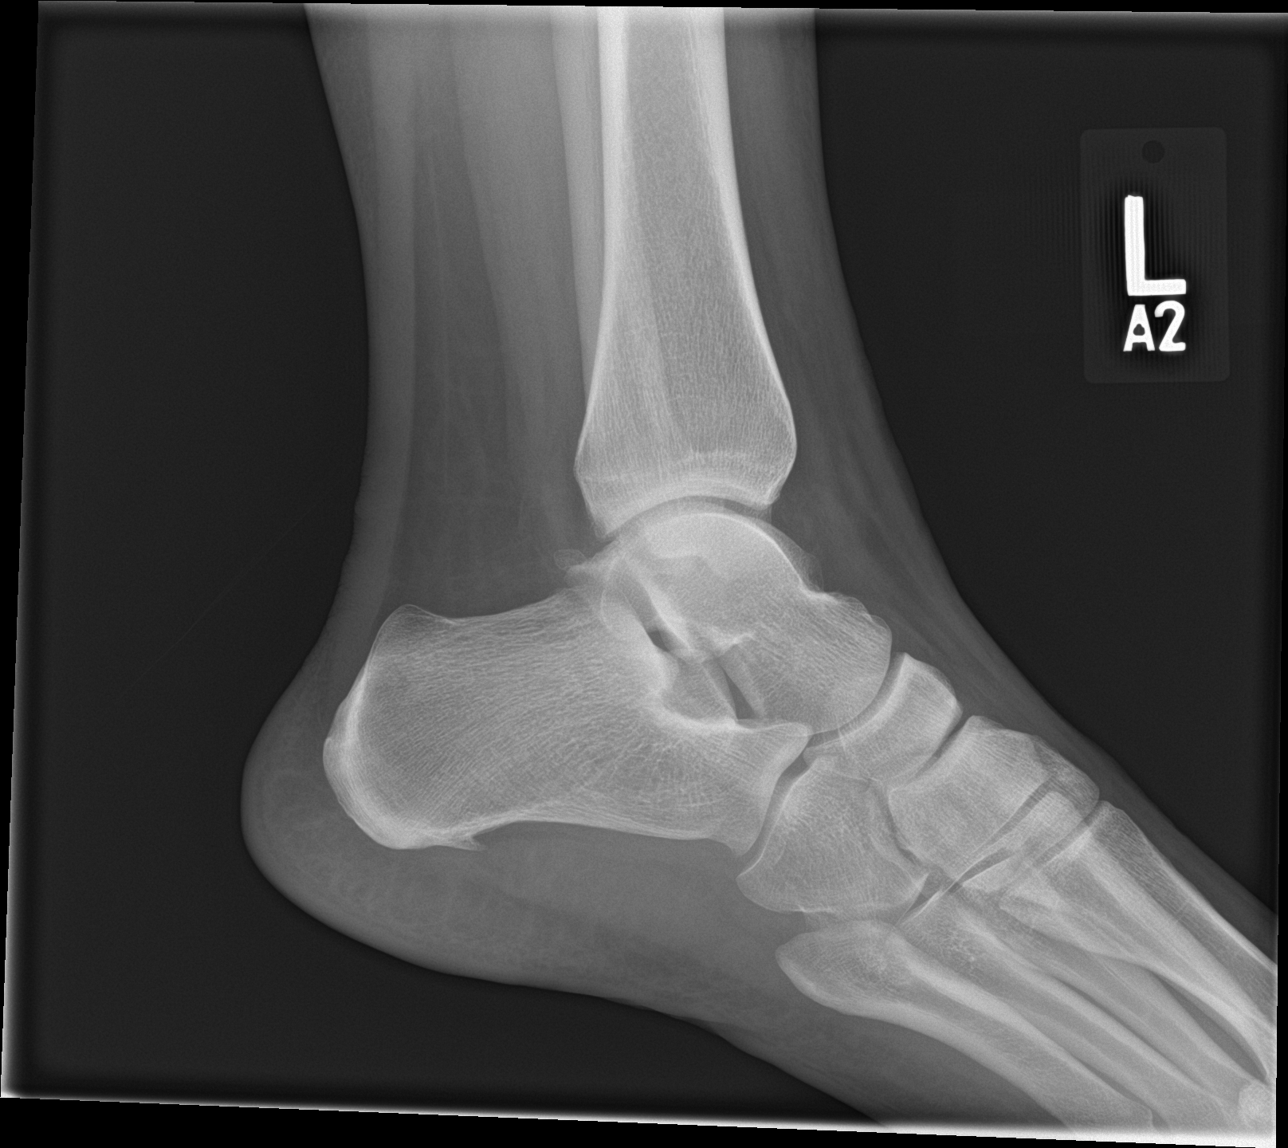

[3 of 3 positions shown; findings below may reference images not displayed]

FINDINGS: Mild medial soft tissue swelling.

Ankle mortise intact.

Osseous mineralization normal.

No acute fracture, dislocation, or bone destruction.

Tiny plantar calcaneal spur.
IMPRESSION: No acute osseous abnormalities.

## 2019-11-14 ENCOUNTER — Other Ambulatory Visit: Payer: Self-pay

## 2019-11-14 ENCOUNTER — Emergency Department: Payer: Medicaid - Out of State

## 2019-11-14 DIAGNOSIS — R509 Fever, unspecified: Secondary | ICD-10-CM | POA: Insufficient documentation

## 2019-11-14 DIAGNOSIS — Z20822 Contact with and (suspected) exposure to covid-19: Secondary | ICD-10-CM | POA: Insufficient documentation

## 2019-11-14 DIAGNOSIS — R0602 Shortness of breath: Secondary | ICD-10-CM | POA: Insufficient documentation

## 2019-11-14 DIAGNOSIS — J45909 Unspecified asthma, uncomplicated: Secondary | ICD-10-CM | POA: Insufficient documentation

## 2019-11-14 DIAGNOSIS — Z5321 Procedure and treatment not carried out due to patient leaving prior to being seen by health care provider: Secondary | ICD-10-CM | POA: Diagnosis not present

## 2019-11-14 LAB — RESP PANEL BY RT PCR (RSV, FLU A&B, COVID)
Influenza A by PCR: NEGATIVE
Influenza B by PCR: NEGATIVE
Respiratory Syncytial Virus by PCR: NEGATIVE
SARS Coronavirus 2 by RT PCR: NEGATIVE

## 2019-11-14 LAB — TROPONIN I (HIGH SENSITIVITY): Troponin I (High Sensitivity): 3 ng/L (ref <18)

## 2019-11-14 LAB — BASIC METABOLIC PANEL
Anion gap: 10 (ref 5–15)
BUN: 9 mg/dL (ref 6–20)
CO2: 23 mmol/L (ref 22–32)
Calcium: 9.6 mg/dL (ref 8.9–10.3)
Chloride: 104 mmol/L (ref 98–111)
Creatinine, Ser: 0.61 mg/dL (ref 0.44–1.00)
GFR calc Af Amer: >60 mL/min (ref >60)
GFR calc non Af Amer: >60 mL/min (ref >60)
Glucose, Bld: 99 mg/dL (ref 70–99)
Potassium: 4.1 mmol/L (ref 3.5–5.1)
Sodium: 137 mmol/L (ref 135–145)

## 2019-11-14 LAB — CBC
HCT: 42.2 % (ref 36.0–46.0)
Hemoglobin: 14.8 g/dL (ref 12.0–15.0)
MCH: 32.5 pg (ref 26.0–34.0)
MCHC: 35.1 g/dL (ref 30.0–36.0)
MCV: 92.5 fL (ref 80.0–100.0)
Platelets: 205 10*3/uL (ref 150–400)
RBC: 4.56 MIL/uL (ref 3.87–5.11)
RDW: 11.9 % (ref 11.5–15.5)
WBC: 10.5 10*3/uL (ref 4.0–10.5)
nRBC: 0 % (ref 0.0–0.2)

## 2019-11-14 NOTE — ED Triage Notes (Signed)
Pt comes POV with chest tightness, SOB, wheezing, for about a week. Has been trying nebulizer but not getting better. Pt chest is very tight, auditory wheezes. States the rest of her family is sick too.

## 2019-11-15 ENCOUNTER — Emergency Department
Admission: EM | Admit: 2019-11-15 | Discharge: 2019-11-15 | Disposition: A | Discharge status: Home / Self Care | Payer: Medicaid - Out of State | Attending: Emergency Medicine | Admitting: Emergency Medicine

## 2019-11-15 NOTE — ED Notes (Signed)
No answer when called several times 

## 2020-08-14 ENCOUNTER — Other Ambulatory Visit: Payer: Self-pay

## 2020-08-14 ENCOUNTER — Emergency Department
Admission: EM | Admit: 2020-08-14 | Discharge: 2020-08-14 | Disposition: A | Discharge status: Home / Self Care | Payer: Self-pay | Attending: Emergency Medicine | Admitting: Emergency Medicine

## 2020-08-14 ENCOUNTER — Emergency Department: Payer: Self-pay

## 2020-08-14 DIAGNOSIS — X501XXA Overexertion from prolonged static or awkward postures, initial encounter: Secondary | ICD-10-CM | POA: Insufficient documentation

## 2020-08-14 DIAGNOSIS — Z79899 Other long term (current) drug therapy: Secondary | ICD-10-CM | POA: Insufficient documentation

## 2020-08-14 DIAGNOSIS — I10 Essential (primary) hypertension: Secondary | ICD-10-CM | POA: Insufficient documentation

## 2020-08-14 DIAGNOSIS — F17210 Nicotine dependence, cigarettes, uncomplicated: Secondary | ICD-10-CM | POA: Insufficient documentation

## 2020-08-14 DIAGNOSIS — S8392XA Sprain of unspecified site of left knee, initial encounter: Secondary | ICD-10-CM | POA: Insufficient documentation

## 2020-08-14 MED ORDER — HYDROCODONE-ACETAMINOPHEN 5-325 MG PO TABS
1.0000 | ORAL_TABLET | Freq: Four times a day (QID) | ORAL | 0 refills | Status: AC | PRN
Start: 2020-08-14 — End: 2021-08-14

## 2020-08-14 NOTE — ED Provider Notes (Signed)
[facility] Emergency Department Provider Note  ____________________________________________   Event Date/Time   First MD Initiated Contact with Patient 08/14/20 [phone]     (approximate)  I have reviewed the triage vital signs and the nursing notes.   HISTORY  Chief Complaint Knee Pain   HPI Tara Horton is a 30 y.o. female presents to the ED with complaint of left knee pain since she slipped on oil that was located in her kitchen causing her to twist her knee and fall.  Patient has had difficulty with range of motion and also bearing weight this morning due to pain.  She has not take any over-the-counter medication for her knee pain.  Denies any head injury or loss of consciousness with her fall.         Past Medical History:  Diagnosis Date   Bronchitis    Hypertension     Patient Active Problem List   Diagnosis Date Noted   Salpingitis/oophoritis, acute 06/28/2016    Past Surgical History:  Procedure Laterality Date   DIAGNOSTIC LAPAROSCOPY WITH REMOVAL OF ECTOPIC PREGNANCY N/A 06/28/2016   Procedure: DIAGNOSTIC LAPAROSCOPY;  Surgeon: Christeen Douglas, MD;  Location: [facility] ORS;  Service: Gynecology;  Laterality: N/A;   DILATION AND CURETTAGE OF UTERUS N/A 07/02/2016   Procedure: DILATATION AND CURETTAGE;  Surgeon: Christeen Douglas, MD;  Location: [facility] OR[facility];  Service: Gynecology;  Laterality: N/A;   LAPAROSCOPY Right 07/02/2016   Procedure: LAPAROSCOPY DIAGNOSTIC;  Surgeon: Christeen Douglas, MD;  Location: [facility] OR[facility];  Service: Gynecology;  Laterality: Right;   TONSILLECTOMY      Prior to Admission medications   Medication Sig Start Date End Date Taking? Authorizing Provider  HYDROcodone-acetaminophen (NORCO/VICODIN) 5-325 MG tablet Take 1 tablet by mouth every 6 (six) hours as needed for moderate pain. 08/14/20 08/14/21 Yes Kathlynn Swofford L, PA-C  albuterol (PROVENTIL HFA;VENTOLIN HFA) 108 (90 Base) MCG/ACT inhaler Inhale 2 puffs into the lungs  every 6 (six) hours as needed for wheezing or shortness of breath. 08/20/16   Enid Derry, PA-C  carvedilol (COREG) 25 MG tablet Take 25 mg by mouth 2 (two) times daily with a meal.    [provider]    Allergies Novocain [procaine] and Ibuprofen  No family history on file.  Social History Social History   Tobacco Use   Smoking status: Every Day    Packs/day: 1.00    Pack years: 0.00    Types: Cigarettes   Smokeless tobacco: Never  Substance Use Topics   Alcohol use: No   Drug use: No    Review of Systems Constitutional: No fever/chills Eyes: No visual changes. Cardiovascular: Denies chest pain. Respiratory: Denies shortness of breath. Gastrointestinal: No abdominal pain.  No nausea, no vomiting.  Musculoskeletal: Positive for left knee pain. Skin: No abrasions. Neurological: Negative for headaches, focal weakness or numbness.  ____________________________________________   PHYSICAL EXAM:  VITAL SIGNS: ED Triage Vitals  Enc Vitals Group     BP 08/14/20 0902 136/70     Pulse Rate 08/14/20 0902 88     Resp 08/14/20 0902 20     Temp 08/14/20 0902 98.2 F (36.8 C)     Temp Source 08/14/20 0902 Oral     SpO2 08/14/20 0902 97 %     Weight 08/14/20 0856 220 lb (99.8 kg)     Height 08/14/20 0856 5\' 6"  (1.676 m)     Head Circumference --      Peak Flow --  Pain Score 08/14/20 0856 10     Pain Loc --      Pain Edu? --      Excl. in GC? --    Constitutional: Alert and oriented. Well appearing and in no acute distress. Eyes: Conjunctivae are normal. PERRL. EOMI. Head: Atraumatic. Neck: No stridor.   Cardiovascular: Normal rate, regular rhythm. Grossly normal heart sounds.  Good peripheral circulation. Respiratory: Normal respiratory effort.  No retractions. Lungs CTAB. Gastrointestinal: Soft and nontender. No distention.  Musculoskeletal: On examination of the left knee there is no gross deformity and on exam range of motion is difficult to assess  due to patient's increased pain.  There is generalized anterior tenderness on palpation.  No effusion present.  Gait was not tested due to patient's increased pain. Neurologic:  Normal speech and language. No gross focal neurologic deficits are appreciated.  Skin:  Skin is warm, dry and intact. No rash noted. Psychiatric: Mood and affect are normal. Speech and behavior are normal.  ____________________________________________   LABS (all labs ordered are listed, but only abnormal results are displayed)  Labs Reviewed - No data to display ____________________________________________ ___________________________________________  RADIOLOGY Beaulah Corin, personally viewed and evaluated these images (plain radiographs) as part of my medical decision making, as well as reviewing the written report by the radiologist.   Official radiology report(s): DG Knee Complete 4 Views Left  Result Date: 08/14/2020 CLINICAL DATA:  Fall, twisted knee with pain EXAM: LEFT KNEE - COMPLETE 4+ VIEW COMPARISON:  None. FINDINGS: No evidence of fracture, dislocation, or joint effusion. No evidence of arthropathy or other focal bone abnormality. Soft tissues are unremarkable. IMPRESSION: Negative. Electronically Signed   By: Guadlupe Spanish M.D.   On: 08/14/2020 10:02    ____________________________________________   PROCEDURES  Procedure(s) performed (including Critical Care):  Procedures   ____________________________________________   INITIAL IMPRESSION / ASSESSMENT AND PLAN / ED COURSE  As part of my medical decision making, I reviewed the following data within the electronic MEDICAL RECORD NUMBER   30 year old female presents to the ED with complaint of left knee pain after she fell on some oil that was on the floor in her kitchen yesterday.  Patient has had difficulty with ambulation today due to pain.  X-rays were reassuring and patient was made aware.  A knee immobilizer was applied and patient  was given crutches.  She is to follow-up with Dr. Okey Dupre who is on-call for orthopedics if any continued problems or not improving.  She is encouraged to ice and elevate and wear the knee immobilizer when she is up walking.  A prescription for hydrocodone was sent to her pharmacy as needed for pain.  She is to return to the emergency department if any severe worsening of her symptoms or urgent concerns over the weekend.  ____________________________________________   FINAL CLINICAL IMPRESSION(S) / ED DIAGNOSES  Final diagnoses:  Sprain of left knee, unspecified ligament, initial encounter     ED Discharge Orders          Ordered    HYDROcodone-acetaminophen (NORCO/VICODIN) 5-325 MG tablet  Every 6 hours PRN        08/14/20 1045             Note:  This document was prepared using Dragon voice recognition software and may include unintentional dictation errors.    Tommi Rumps, PA-C 08/14/20 1447    Gilles Chiquito, MD 08/15/20 [phone]

## 2020-08-14 NOTE — Discharge Instructions (Addendum)
Call make an appointment with orthopedist there is listed on your discharge papers.  Dr. Okey Dupre is located in [place] and his address and phone number listed on your discharge papers.  Ice and elevation to reduce swelling and help with pain.  Wear the knee immobilizer anytime you are up walking.  Do not necessarily have to wear it while you are sleeping.  You were also given crutches to use to help bear weight.  If not improving you will need to definitely see a an orthopedist for follow-up

## 2020-08-14 NOTE — ED Notes (Signed)
See triage note  Presents with leg knee pain  States she twisted her knee and fell yesterday  Pain is mainly posterior   Is able to bear wt but has increased pain when bending knee

## 2020-08-14 NOTE — ED Triage Notes (Signed)
Pt states she twisted her knee and fell yesterday and having left knee pain since.
